# Patient Record
Sex: Female | Born: 1975 | Race: Black or African American | Hispanic: No | Marital: Single | Smoking: Current every day smoker
Health system: Southern US, Community
[De-identification: ages and names within clinical notes are randomized; demographics above are authoritative.]

## PROBLEM LIST (undated history)

## (undated) DIAGNOSIS — J45909 Unspecified asthma, uncomplicated: Secondary | ICD-10-CM

## (undated) DIAGNOSIS — E079 Disorder of thyroid, unspecified: Secondary | ICD-10-CM

## (undated) DIAGNOSIS — E119 Type 2 diabetes mellitus without complications: Secondary | ICD-10-CM

## (undated) DIAGNOSIS — F319 Bipolar disorder, unspecified: Secondary | ICD-10-CM

## (undated) HISTORY — PX: APPENDECTOMY: SHX54

---

## 2006-12-17 ENCOUNTER — Emergency Department (HOSPITAL_COMMUNITY): Admission: EM | Admit: 2006-12-17 | Discharge: 2006-12-17 | Payer: Self-pay | Admitting: Emergency Medicine

## 2011-01-28 LAB — PREGNANCY, URINE: Preg Test, Ur: NEGATIVE

## 2011-01-28 LAB — CBC
MCHC: 34
Platelets: 238
RDW: 13.4

## 2011-01-28 LAB — BASIC METABOLIC PANEL
BUN: 4 — ABNORMAL LOW
CO2: 23
Calcium: 9.5
Creatinine, Ser: 0.76
GFR calc Af Amer: >60
Glucose, Bld: 89

## 2016-03-15 ENCOUNTER — Encounter (HOSPITAL_BASED_OUTPATIENT_CLINIC_OR_DEPARTMENT_OTHER): Payer: Self-pay

## 2016-03-15 ENCOUNTER — Emergency Department (HOSPITAL_BASED_OUTPATIENT_CLINIC_OR_DEPARTMENT_OTHER)
Admission: EM | Admit: 2016-03-15 | Discharge: 2016-03-15 | Disposition: A | Discharge status: Home / Self Care | Payer: Medicare HMO | Attending: Emergency Medicine | Admitting: Emergency Medicine

## 2016-03-15 DIAGNOSIS — M545 Low back pain: Secondary | ICD-10-CM | POA: Diagnosis not present

## 2016-03-15 DIAGNOSIS — F17200 Nicotine dependence, unspecified, uncomplicated: Secondary | ICD-10-CM | POA: Diagnosis not present

## 2016-03-15 DIAGNOSIS — W19XXXA Unspecified fall, initial encounter: Secondary | ICD-10-CM

## 2016-03-15 DIAGNOSIS — S3992XA Unspecified injury of lower back, initial encounter: Secondary | ICD-10-CM | POA: Diagnosis present

## 2016-03-15 DIAGNOSIS — Y929 Unspecified place or not applicable: Secondary | ICD-10-CM | POA: Insufficient documentation

## 2016-03-15 DIAGNOSIS — Y9301 Activity, walking, marching and hiking: Secondary | ICD-10-CM | POA: Insufficient documentation

## 2016-03-15 DIAGNOSIS — Y999 Unspecified external cause status: Secondary | ICD-10-CM | POA: Insufficient documentation

## 2016-03-15 DIAGNOSIS — W1839XA Other fall on same level, initial encounter: Secondary | ICD-10-CM | POA: Insufficient documentation

## 2016-03-15 DIAGNOSIS — J45909 Unspecified asthma, uncomplicated: Secondary | ICD-10-CM | POA: Insufficient documentation

## 2016-03-15 HISTORY — DX: Bipolar disorder, unspecified: F31.9

## 2016-03-15 HISTORY — DX: Unspecified asthma, uncomplicated: J45.909

## 2016-03-15 HISTORY — DX: Disorder of thyroid, unspecified: E07.9

## 2016-03-15 MED ORDER — CYCLOBENZAPRINE HCL 10 MG PO TABS: 10.0000 mg | ORAL_TABLET | Freq: Two times a day (BID) | ORAL | 0 refills | Status: DC | PRN

## 2016-03-15 NOTE — ED Provider Notes (Signed)
Emergency Department Provider Note   I have reviewed the triage vital signs and the nursing notes.  By signing my name below, I, Bridgette HabermannMaria Tan, attest that this documentation has been prepared under the direction and in the presence of Maia PlanJoshua G Merlen Gurry, MD. Electronically Signed: Bridgette HabermannMaria Tan, ED Scribe. 03/15/16. 10:29 PM.  HISTORY  Chief Complaint Fall  HPI Comments: Shelly Mcclain is a 40 y.o. female with h/o asthma who presents to the Emergency Department complaining of bilateral buttock pain s/p mechanical fall onset earlier today. Pt states she was walking when she fell on her wooden patio and landed on her buttocks. No LOC. Pt denies head injury. Pain is exacerbated with movement and direct palpation to the area and improved when standing. She has taken 3 Tylenol PM with moderate relief to her symptoms. Denies any additional injuries. Pt further denies chest pain, difficulty breathing, fever, or any other associated symptoms.   Past Medical History:  Diagnosis Date  . Asthma   . Bipolar disorder (HCC)   . Thyroid disease     There are no active problems to display for this patient.   Past Surgical History:  Procedure Laterality Date  . APPENDECTOMY        Allergies Patient has no known allergies.  No family history on file.  Social History Social History  Substance Use Topics  . Smoking status: Current Every Day Smoker  . Smokeless tobacco: Never Used  . Alcohol use No    Review of Systems Constitutional: No fever/chills Eyes: No visual changes. ENT: No sore throat. Cardiovascular: Denies chest pain. Respiratory: Denies shortness of breath. Gastrointestinal: No abdominal pain.  No nausea, no vomiting.  No diarrhea.  No constipation. Genitourinary: Negative for dysuria. Musculoskeletal: Negative for back pain. Skin: Negative for rash. Neurological: Negative for headaches, focal weakness or numbness. 10-point ROS otherwise  negative.  ____________________________________________   PHYSICAL EXAM:  VITAL SIGNS: ED Triage Vitals  Enc Vitals Group     BP 03/15/16 2040 135/88     Pulse Rate 03/15/16 2040 97     Resp 03/15/16 2040 18     Temp 03/15/16 2040 98.1 F (36.7 C)     Temp Source 03/15/16 2040 Oral     SpO2 03/15/16 2040 98 %     Weight 03/15/16 2039 230 lb (104.3 kg)     Height 03/15/16 2039 5\' 3"  (1.6 m)     Pain Score 03/15/16 2037 8    Constitutional: Alert and oriented. Well appearing and in no acute distress. Eyes: Conjunctivae are normal.  Head: Atraumatic. Nose: No congestion/rhinnorhea. Mouth/Throat: Mucous membranes are moist.  Neck: No stridor.  No cervical spine tenderness to palpation. Cardiovascular: Normal rate, regular rhythm. Good peripheral circulation. Grossly normal heart sounds.   Respiratory: Normal respiratory effort.  No retractions. Lungs CTAB. Gastrointestinal: Soft and nontender. No distention.  Musculoskeletal: No lower extremity tenderness nor edema. No gross deformities of extremities. No midline or paraspinal tenderness.  Neurologic:  Normal speech and language. No gross focal neurologic deficits are appreciated.  Skin:  Skin is warm, dry and intact. No rash noted.  ____________________________________________  RADIOLOGY  None ____________________________________________   PROCEDURES  Procedure(s) performed:   Procedures  None ____________________________________________   INITIAL IMPRESSION / ASSESSMENT AND PLAN / ED COURSE  Pertinent labs & imaging results that were available during my care of the patient were reviewed by me and considered in my medical decision making (see chart for details).  Patient presents to the  ED with lower back pain in the setting of mechanical fall. No midline spine tenderness. Patient is ambulatory with full ROM of all extremities. No point tenderness to require imaging at this time. Suspect muscle strain. Plan for  Robaxin at home and PCP follow up PRN.   At this time, I do not feel there is any life-threatening condition present. I have reviewed and discussed all results (EKG, imaging, lab, urine as appropriate), exam findings with patient. I have reviewed nursing notes and appropriate previous records.  I feel the patient is safe to be discharged home without further emergent workup. Discussed usual and customary return precautions. Patient and family (if present) verbalize understanding and are comfortable with this plan.  Patient will follow-up with their primary care provider. If they do not have a primary care provider, information for follow-up has been provided to them. All questions have been answered.   ____________________________________________  FINAL CLINICAL IMPRESSION(S) / ED DIAGNOSES  Final diagnoses:  Fall, initial encounter     MEDICATIONS GIVEN DURING THIS VISIT:  None  NEW OUTPATIENT MEDICATIONS STARTED DURING THIS VISIT:  Discharge Medication List as of 03/15/2016 10:31 PM    START taking these medications   Details  cyclobenzaprine (FLEXERIL) 10 MG tablet Take 1 tablet (10 mg total) by mouth 2 (two) times daily as needed for muscle spasms., Starting Tue 03/15/2016, Print       I personally performed the services described in this documentation, which was scribed in my presence. The recorded information has been reviewed and is accurate.    Note:  This document was prepared using Dragon voice recognition software and may include unintentional dictation errors.  Alona BeneJoshua Virgilio Broadhead, MD Emergency Medicine    Maia PlanJoshua G Bomani Oommen, MD 03/16/16 [phone]

## 2016-03-15 NOTE — ED Triage Notes (Signed)
Slip/fall today-pain to "my butt' and left wrist-NAD-slow gait

## 2016-03-15 NOTE — Discharge Instructions (Signed)

## 2016-10-31 ENCOUNTER — Emergency Department (HOSPITAL_BASED_OUTPATIENT_CLINIC_OR_DEPARTMENT_OTHER)
Admission: EM | Admit: 2016-10-31 | Discharge: 2016-10-31 | Disposition: A | Discharge status: Home / Self Care | Payer: Medicare Other | Attending: Emergency Medicine | Admitting: Emergency Medicine

## 2016-10-31 ENCOUNTER — Encounter (HOSPITAL_BASED_OUTPATIENT_CLINIC_OR_DEPARTMENT_OTHER): Payer: Self-pay | Admitting: Emergency Medicine

## 2016-10-31 DIAGNOSIS — H66001 Acute suppurative otitis media without spontaneous rupture of ear drum, right ear: Secondary | ICD-10-CM | POA: Insufficient documentation

## 2016-10-31 DIAGNOSIS — H9201 Otalgia, right ear: Secondary | ICD-10-CM | POA: Diagnosis present

## 2016-10-31 DIAGNOSIS — H6691 Otitis media, unspecified, right ear: Secondary | ICD-10-CM

## 2016-10-31 DIAGNOSIS — J45909 Unspecified asthma, uncomplicated: Secondary | ICD-10-CM | POA: Insufficient documentation

## 2016-10-31 DIAGNOSIS — F17200 Nicotine dependence, unspecified, uncomplicated: Secondary | ICD-10-CM | POA: Insufficient documentation

## 2016-10-31 MED ORDER — CETIRIZINE HCL 10 MG PO TABS: 10.0000 mg | ORAL_TABLET | Freq: Every day | ORAL | 0 refills | Status: DC

## 2016-10-31 MED ORDER — AMOXICILLIN 875 MG PO TABS: 875.0000 mg | ORAL_TABLET | Freq: Two times a day (BID) | ORAL | 0 refills | Status: AC

## 2016-10-31 NOTE — ED Triage Notes (Signed)
Pt presents with c/o right ear pain for the past week

## 2016-10-31 NOTE — Discharge Instructions (Signed)
Please take Ibuprofen (Advil, motrin) and Tylenol (acetaminophen) to relieve your pain.  You may take up to 600 MG (3 pills) of normal strength ibuprofen every 8 hours as needed.  In between doses of ibuprofen you make take tylenol, up to 1,000 mg (two extra strength pills).  Do not take more than 4,000 mg tylenol in a 24 hour period.  Please check all medication labels as many medications such as pain and cold medications may contain tylenol.  Do not drink alcohol while taking these medications.  Do not take other NSAID'S while taking ibuprofen (such as aleve or naproxen).  Please take ibuprofen with food to decrease stomach upset.  Your antibiotic may give you diarrhea.  Please stay well hydrated.

## 2016-10-31 NOTE — ED Notes (Signed)
ED Provider at bedside. 

## 2016-10-31 NOTE — ED Provider Notes (Signed)
[facility]   CSN: [id number] Arrival date & time: 10/31/16  0029     History   Chief Complaint Chief Complaint  Patient presents with  . Otalgia    HPI Shelly Mcclain is a 41 y.o. female who presents with 1 week of gradually worsening right ear pain. She has not tried any interventions PTA or seen any providers for this complaint so far. She says that she has some mild throat discomfort on her right side.    HPI  Past Medical History:  Diagnosis Date  . Asthma   . Bipolar disorder (HCC)   . Thyroid disease     There are no active problems to display for this patient.   Past Surgical History:  Procedure Laterality Date  . APPENDECTOMY      OB History    No data available       Home Medications    Prior to Admission medications   Medication Sig Start Date End Date Taking? Authorizing Provider  amoxicillin (AMOXIL) 875 MG tablet Take 1 tablet (875 mg total) by mouth 2 (two) times daily. 10/31/16 11/07/16  Cristina Gong, PA-C  cetirizine (ZYRTEC) 10 MG tablet Take 1 tablet (10 mg total) by mouth daily. 10/31/16   Cristina Gong, PA-C  cyclobenzaprine (FLEXERIL) 10 MG tablet Take 1 tablet (10 mg total) by mouth 2 (two) times daily as needed for muscle spasms. 03/15/16   Long, Arlyss Repress, MD    Family History No family history on file.  Social History Social History  Substance Use Topics  . Smoking status: Current Every Day Smoker  . Smokeless tobacco: Never Used  . Alcohol use No     Allergies   Patient has no known allergies.   Review of Systems Review of Systems  Constitutional: Negative for fatigue and fever.  HENT: Positive for ear pain and sore throat. Negative for congestion, drooling, ear discharge, facial swelling, postnasal drip, trouble swallowing and voice change.   Gastrointestinal: Negative for nausea.  Skin: Negative for color change and wound.  Neurological: Negative for light-headedness and  headaches.     Physical Exam Updated Vital Signs BP 118/83 (BP Location: Left Arm)   Pulse 86   Temp 97.9 F (36.6 C) (Oral)   Resp 19   Ht 5\' 2"  (1.575 m)   Wt 104.3 kg (230 lb)   SpO2 100%   BMI 42.07 kg/m   Physical Exam  Constitutional: She appears well-developed and well-nourished.  HENT:  Head: Normocephalic and atraumatic.  Right Ear: External ear and ear canal normal. No drainage or swelling. No mastoid tenderness. Tympanic membrane is erythematous. Tympanic membrane is not bulging. A middle ear effusion is present.  Left Ear: Tympanic membrane, external ear and ear canal normal.  Nose: Mucosal edema present. Right sinus exhibits no maxillary sinus tenderness and no frontal sinus tenderness. Left sinus exhibits no maxillary sinus tenderness and no frontal sinus tenderness.  Mouth/Throat: Uvula is midline, oropharynx is clear and moist and mucous membranes are normal. No trismus in the jaw. No dental abscesses or uvula swelling. No oropharyngeal exudate, posterior oropharyngeal edema or posterior oropharyngeal erythema. No tonsillar exudate.  Eyes: Conjunctivae are normal. No scleral icterus.  Neck: Trachea normal, normal range of motion and phonation normal. Neck supple. No neck rigidity. No tracheal deviation and normal range of motion present.  Pulmonary/Chest: Effort normal and breath sounds normal. No stridor. No respiratory distress.  Lymphadenopathy:    She has no  cervical adenopathy.  Skin: Skin is warm and dry. She is not diaphoretic.  Psychiatric: Her affect is blunt.  Nursing note and vitals reviewed.    ED Treatments / Results  Labs (all labs ordered are listed, but only abnormal results are displayed) Labs Reviewed - No data to display  EKG  EKG Interpretation None       Radiology No results found.  Procedures Procedures (including critical care time)  Medications Ordered in ED Medications - No data to display   Initial Impression /  Assessment and Plan / ED Course  I have reviewed the triage vital signs and the nursing notes.  Pertinent labs & imaging results that were available during my care of the patient were reviewed by me and considered in my medical decision making (see chart for details).    Patient presents with otalgia and exam consistent with acute otitis media. No concern for acute mastoiditis, meningitis.  No antibiotic use in the last month.  Patient discharged home with Amoxicillin.  Suggest symptoms may be exacerbated by seasonal allergies, will give rx for zyrtec as she says she has run out.  I have also discussed reasons to return immediately to the ER.  Patient expresses understanding and agrees with plan.  The patient was seen by Dr. Patria Maneampos.     Final Clinical Impressions(s) / ED Diagnoses   Final diagnoses:  Right otitis media, unspecified otitis media type    New Prescriptions New Prescriptions   AMOXICILLIN (AMOXIL) 875 MG TABLET    Take 1 tablet (875 mg total) by mouth 2 (two) times daily.   CETIRIZINE (ZYRTEC) 10 MG TABLET    Take 1 tablet (10 mg total) by mouth daily.     Norman ClayHammond, Tihanna Goodson W, PA-C 10/31/16 [id number]    Azalia Bilisampos, Kevin, MD 10/31/16 [phone]

## 2017-03-11 ENCOUNTER — Other Ambulatory Visit: Payer: Self-pay

## 2017-03-11 ENCOUNTER — Emergency Department (HOSPITAL_BASED_OUTPATIENT_CLINIC_OR_DEPARTMENT_OTHER)
Admission: EM | Admit: 2017-03-11 | Discharge: 2017-03-11 | Disposition: A | Discharge status: Home / Self Care | Payer: Medicare Other | Attending: Emergency Medicine | Admitting: Emergency Medicine

## 2017-03-11 ENCOUNTER — Encounter (HOSPITAL_BASED_OUTPATIENT_CLINIC_OR_DEPARTMENT_OTHER): Payer: Self-pay | Admitting: Emergency Medicine

## 2017-03-11 DIAGNOSIS — Z79899 Other long term (current) drug therapy: Secondary | ICD-10-CM | POA: Diagnosis not present

## 2017-03-11 DIAGNOSIS — J020 Streptococcal pharyngitis: Secondary | ICD-10-CM | POA: Diagnosis not present

## 2017-03-11 DIAGNOSIS — J45909 Unspecified asthma, uncomplicated: Secondary | ICD-10-CM | POA: Insufficient documentation

## 2017-03-11 DIAGNOSIS — R05 Cough: Secondary | ICD-10-CM | POA: Diagnosis not present

## 2017-03-11 DIAGNOSIS — F17210 Nicotine dependence, cigarettes, uncomplicated: Secondary | ICD-10-CM | POA: Diagnosis not present

## 2017-03-11 DIAGNOSIS — J029 Acute pharyngitis, unspecified: Secondary | ICD-10-CM | POA: Diagnosis present

## 2017-03-11 LAB — RAPID STREP SCREEN (MED CTR MEBANE ONLY): Streptococcus, Group A Screen (Direct): POSITIVE — AB

## 2017-03-11 MED ORDER — IBUPROFEN 400 MG PO TABS
600.0000 mg | ORAL_TABLET | Freq: Once | ORAL | Status: AC
  Administered 2017-03-11: 600 mg via ORAL
  Filled 2017-03-11: qty 1

## 2017-03-11 MED ORDER — PENICILLIN G BENZATHINE 1200000 UNIT/2ML IM SUSP
1.2000 10*6.[IU] | Freq: Once | INTRAMUSCULAR | Status: AC
  Administered 2017-03-11: 1.2 10*6.[IU] via INTRAMUSCULAR
  Filled 2017-03-11: qty 2

## 2017-03-11 MED ORDER — DEXAMETHASONE SODIUM PHOSPHATE 10 MG/ML IJ SOLN
10.0000 mg | Freq: Once | INTRAMUSCULAR | Status: AC
  Administered 2017-03-11: 10 mg
  Filled 2017-03-11: qty 1

## 2017-03-11 NOTE — ED Provider Notes (Signed)
[facility] EMERGENCY DEPARTMENT Provider Note   CSN: [id number] Arrival date & time: 03/11/17  1611     History   Chief Complaint Chief Complaint  Patient presents with  . Sore Throat  . Otalgia    HPI Shelly Mcclain is a 41 y.o. female.  HPI 41-year African-American female past medical history significant for asthma and thyroid disease presents to the emergency department today with complaints of sore throat and right ear pain.  Patient states her symptoms have been ongoing for 2 days and progressively worsening.  Reports associated rhinorrhea, nonproductive cough, low-grade fever.  Patient has been trying throat spray to help numb her throat.  Reports sick contacts.  Nothing makes her symptoms better or worse.  Patient is able to tolerate p.o. fluids. Past Medical History:  Diagnosis Date  . Asthma   . Bipolar disorder (HCC)   . Thyroid disease     There are no active problems to display for this patient.   Past Surgical History:  Procedure Laterality Date  . APPENDECTOMY      OB History    No data available       Home Medications    Prior to Admission medications   Medication Sig Start Date End Date Taking? Authorizing Provider  levothyroxine (SYNTHROID, LEVOTHROID) 75 MCG tablet Take 75 mcg by mouth daily before breakfast.   Yes [provider]  cetirizine (ZYRTEC) 10 MG tablet Take 1 tablet (10 mg total) by mouth daily. 10/31/16   Cristina GongHammond, Elizabeth W, PA-C  cyclobenzaprine (FLEXERIL) 10 MG tablet Take 1 tablet (10 mg total) by mouth 2 (two) times daily as needed for muscle spasms. 03/15/16   Long, Arlyss RepressJoshua G, MD    Family History No family history on file.  Social History Social History   Tobacco Use  . Smoking status: Current Every Day Smoker  . Smokeless tobacco: Never Used  Substance Use Topics  . Alcohol use: No  . Drug use: No     Allergies   Patient has no known allergies.   Review of Systems Review of Systems    Constitutional: Negative for chills and fever.  HENT: Positive for congestion, ear pain, postnasal drip, rhinorrhea and sore throat.   Respiratory: Positive for cough. Negative for shortness of breath.   Gastrointestinal: Negative for vomiting.  Musculoskeletal: Negative for myalgias.  Skin: Negative for rash.     Physical Exam Updated Vital Signs BP 105/75 (BP Location: Left Arm)   Pulse 99   Resp 18   Ht 5\' 3"  (1.6 m)   Wt 108.9 kg (240 lb)   LMP 03/12/2016   SpO2 100%   BMI 42.51 kg/m   Physical Exam  Constitutional: She appears well-developed and well-nourished. No distress.  HENT:  Head: Normocephalic and atraumatic.  Right Ear: Tympanic membrane and ear canal normal. No middle ear effusion.  Left Ear: Tympanic membrane and ear canal normal.  No middle ear effusion.  Mouth/Throat: Uvula is midline and mucous membranes are normal. No uvula swelling. Posterior oropharyngeal erythema present. No oropharyngeal exudate, posterior oropharyngeal edema or tonsillar abscesses. Tonsils are 1+ on the right. Tonsils are 1+ on the left. No tonsillar exudate.  Eyes: Right eye exhibits no discharge. Left eye exhibits no discharge. No scleral icterus.  Neck: Normal range of motion. Neck supple.  No c spine midline tenderness. No paraspinal tenderness. No deformities or step offs noted. Full ROM. Supple. No nuchal rigidity.    Pulmonary/Chest: Effort normal and breath sounds  normal. No stridor. No respiratory distress. She has no wheezes. She has no rhonchi. She has no rales. She exhibits no tenderness.  Musculoskeletal: Normal range of motion.  Neurological: She is alert.  Skin: No pallor.  Psychiatric: Her behavior is normal. Judgment and thought content normal.  Nursing note and vitals reviewed.    ED Treatments / Results  Labs (all labs ordered are listed, but only abnormal results are displayed) Labs Reviewed  RAPID STREP SCREEN (NOT AT [facility])    EKG  EKG  Interpretation None       Radiology No results found.  Procedures Procedures (including critical care time)  Medications Ordered in ED Medications  ibuprofen (ADVIL,MOTRIN) tablet 600 mg (600 mg Oral Given 03/11/17 1651)     Initial Impression / Assessment and Plan / ED Course  I have reviewed the triage vital signs and the nursing notes.  Pertinent labs & imaging results that were available during my care of the patient were reviewed by me and considered in my medical decision making (see chart for details).    Pt febrile with tonsillar exudate, cervical lymphadenopathy, & dysphagia; diagnosis of strep. Treated in the Ed with steroids, NSAIDs, Pain medication and PCN IM.  Pt appears mildly dehydrated, discussed importance of water rehydration. Presentation non concerning for PTA or infxn spread to soft tissue. No trismus or uvula deviation. No meningeal signs.  Specific return precautions discussed. Pt able to drink water in ED without difficulty with intact air way. Recommended PCP follow up.    Final Clinical Impressions(s) / ED Diagnoses   Final diagnoses:  Strep pharyngitis    ED Discharge Orders    None       Rise MuLeaphart, Sadira Standard T, PA-C 03/11/17 1748    Rise MuLeaphart, Arshawn Valdez T, PA-C 03/11/17 1749    Alvira MondaySchlossman, Erin, MD 03/16/17 [phone]

## 2017-03-11 NOTE — ED Triage Notes (Signed)
Sore throat and R ear pain x 2 days.

## 2017-03-11 NOTE — Discharge Instructions (Signed)
You have been diagnosed with strep throat.  discard  you or your child's toothbrush and begin using a new one in 3 days. For sore throat, take ibuprofen or tylenol every 6hr as needed. Follow up with your doctor in 2-3 days if no improvement. Return to the ED sooner for worsening condition, inability to swallow, breathing difficulty, new concerns.

## 2019-08-20 ENCOUNTER — Other Ambulatory Visit: Payer: Self-pay

## 2019-08-20 ENCOUNTER — Encounter (HOSPITAL_BASED_OUTPATIENT_CLINIC_OR_DEPARTMENT_OTHER): Payer: Self-pay | Admitting: Emergency Medicine

## 2019-08-20 ENCOUNTER — Emergency Department (HOSPITAL_BASED_OUTPATIENT_CLINIC_OR_DEPARTMENT_OTHER)
Admission: EM | Admit: 2019-08-20 | Discharge: 2019-08-21 | Disposition: A | Discharge status: Home / Self Care | Payer: Medicare Other | Attending: Emergency Medicine | Admitting: Emergency Medicine

## 2019-08-20 DIAGNOSIS — J45909 Unspecified asthma, uncomplicated: Secondary | ICD-10-CM | POA: Insufficient documentation

## 2019-08-20 DIAGNOSIS — U071 COVID-19: Secondary | ICD-10-CM | POA: Diagnosis not present

## 2019-08-20 DIAGNOSIS — E119 Type 2 diabetes mellitus without complications: Secondary | ICD-10-CM | POA: Insufficient documentation

## 2019-08-20 DIAGNOSIS — Z79899 Other long term (current) drug therapy: Secondary | ICD-10-CM | POA: Insufficient documentation

## 2019-08-20 DIAGNOSIS — Z7984 Long term (current) use of oral hypoglycemic drugs: Secondary | ICD-10-CM | POA: Insufficient documentation

## 2019-08-20 DIAGNOSIS — Z7982 Long term (current) use of aspirin: Secondary | ICD-10-CM | POA: Diagnosis not present

## 2019-08-20 DIAGNOSIS — F17200 Nicotine dependence, unspecified, uncomplicated: Secondary | ICD-10-CM | POA: Diagnosis not present

## 2019-08-20 DIAGNOSIS — R0789 Other chest pain: Secondary | ICD-10-CM

## 2019-08-20 HISTORY — DX: Type 2 diabetes mellitus without complications: E11.9

## 2019-08-20 MED ORDER — FLUTICASONE FUROATE-VILANTEROL 100-25 MCG/INH IN AEPB
1.00 | INHALATION_SPRAY | RESPIRATORY_TRACT | Status: DC
Start: 2019-08-19 — End: 2019-08-20

## 2019-08-20 MED ORDER — ACETAMINOPHEN 325 MG PO TABS
650.00 | ORAL_TABLET | ORAL | Status: DC
Start: ? — End: 2019-08-20

## 2019-08-20 MED ORDER — SORBITOL 70 % PO SOLN
30.00 | ORAL | Status: DC
Start: ? — End: 2019-08-20

## 2019-08-20 MED ORDER — ALBUTEROL SULFATE HFA 108 (90 BASE) MCG/ACT IN AERS
2.00 | INHALATION_SPRAY | RESPIRATORY_TRACT | Status: DC
Start: ? — End: 2019-08-20

## 2019-08-20 MED ORDER — MELATONIN 3 MG PO TABS
3.00 | ORAL_TABLET | ORAL | Status: DC
Start: ? — End: 2019-08-20

## 2019-08-20 MED ORDER — DEXTROMETHORPHAN-GUAIFENESIN 10-100 MG/5ML PO LIQD
5.00 | ORAL | Status: DC
Start: ? — End: 2019-08-20

## 2019-08-20 MED ORDER — GLUCAGON (RDNA) 1 MG IJ KIT
1.00 | PACK | INTRAMUSCULAR | Status: DC
Start: ? — End: 2019-08-20

## 2019-08-20 MED ORDER — FERROUS SULFATE 325 (65 FE) MG PO TABS
325.00 | ORAL_TABLET | ORAL | Status: DC
Start: 2019-08-19 — End: 2019-08-20

## 2019-08-20 MED ORDER — PANTOPRAZOLE SODIUM 40 MG PO TBEC
40.00 | DELAYED_RELEASE_TABLET | ORAL | Status: DC
Start: 2019-08-19 — End: 2019-08-20

## 2019-08-20 MED ORDER — ONDANSETRON HCL 4 MG/2ML IJ SOLN
4.00 | INTRAMUSCULAR | Status: DC
Start: ? — End: 2019-08-20

## 2019-08-20 MED ORDER — HYDROXYZINE HCL 25 MG PO TABS
25.00 | ORAL_TABLET | ORAL | Status: DC
Start: ? — End: 2019-08-20

## 2019-08-20 MED ORDER — ATORVASTATIN CALCIUM 10 MG PO TABS
20.00 | ORAL_TABLET | ORAL | Status: DC
Start: 2019-08-19 — End: 2019-08-20

## 2019-08-20 MED ORDER — OXCARBAZEPINE 300 MG PO TABS
300.00 | ORAL_TABLET | ORAL | Status: DC
Start: 2019-08-18 — End: 2019-08-20

## 2019-08-20 MED ORDER — DEXTROSE 10 % IV SOLN
125.00 | INTRAVENOUS | Status: DC
Start: ? — End: 2019-08-20

## 2019-08-20 MED ORDER — INSULIN LISPRO 100 UNIT/ML ~~LOC~~ SOLN
2.00 | SUBCUTANEOUS | Status: DC
Start: 2019-08-18 — End: 2019-08-20

## 2019-08-20 MED ORDER — BENZTROPINE MESYLATE 1 MG PO TABS
0.50 | ORAL_TABLET | ORAL | Status: DC
Start: 2019-08-18 — End: 2019-08-20

## 2019-08-20 MED ORDER — ASPIRIN 81 MG PO TBEC
81.00 | DELAYED_RELEASE_TABLET | ORAL | Status: DC
Start: 2019-08-19 — End: 2019-08-20

## 2019-08-20 MED ORDER — GLUCOSE 40 % PO GEL
15.00 | ORAL | Status: DC
Start: ? — End: 2019-08-20

## 2019-08-20 MED ORDER — BUSPIRONE HCL 15 MG PO TABS
7.50 | ORAL_TABLET | ORAL | Status: DC
Start: 2019-08-18 — End: 2019-08-20

## 2019-08-20 MED ORDER — BISACODYL 5 MG PO TBEC
10.00 | DELAYED_RELEASE_TABLET | ORAL | Status: DC
Start: ? — End: 2019-08-20

## 2019-08-20 MED ORDER — SODIUM CHLORIDE FLUSH 0.9 % IV SOLN
5.00 | INTRAVENOUS | Status: DC
Start: 2019-08-18 — End: 2019-08-20

## 2019-08-20 MED ORDER — CLONIDINE HCL 0.1 MG PO TABS
0.10 | ORAL_TABLET | ORAL | Status: DC
Start: ? — End: 2019-08-20

## 2019-08-20 MED ORDER — LEVOTHYROXINE SODIUM 125 MCG PO TABS
125.00 | ORAL_TABLET | ORAL | Status: DC
Start: 2019-08-19 — End: 2019-08-20

## 2019-08-20 MED ORDER — PERPHENAZINE 2 MG PO TABS
4.00 | ORAL_TABLET | ORAL | Status: DC
Start: 2019-08-18 — End: 2019-08-20

## 2019-08-20 MED ORDER — DSS 100 MG PO CAPS
100.00 | ORAL_CAPSULE | ORAL | Status: DC
Start: ? — End: 2019-08-20

## 2019-08-20 MED ORDER — SODIUM CHLORIDE FLUSH 0.9 % IV SOLN
5.00 | INTRAVENOUS | Status: DC
Start: ? — End: 2019-08-20

## 2019-08-20 NOTE — ED Triage Notes (Signed)
States chest pressure and cough since last weekend. Tested COVID + on Sunday

## 2019-08-21 ENCOUNTER — Emergency Department (HOSPITAL_BASED_OUTPATIENT_CLINIC_OR_DEPARTMENT_OTHER): Payer: Medicare Other

## 2019-08-21 LAB — URINALYSIS, MICROSCOPIC (REFLEX)

## 2019-08-21 LAB — COMPREHENSIVE METABOLIC PANEL
ALT: 15 U/L (ref 0–44)
AST: 16 U/L (ref 15–41)
Albumin: 3.9 g/dL (ref 3.5–5.0)
Alkaline Phosphatase: 52 U/L (ref 38–126)
Anion gap: 9 (ref 5–15)
BUN: 10 mg/dL (ref 6–20)
CO2: 25 mmol/L (ref 22–32)
Calcium: 9 mg/dL (ref 8.9–10.3)
Chloride: 104 mmol/L (ref 98–111)
Creatinine, Ser: 0.73 mg/dL (ref 0.44–1.00)
GFR calc Af Amer: >60 mL/min (ref >60)
GFR calc non Af Amer: >60 mL/min (ref >60)
Glucose, Bld: 82 mg/dL (ref 70–99)
Potassium: 3.7 mmol/L (ref 3.5–5.1)
Sodium: 138 mmol/L (ref 135–145)
Total Bilirubin: 0.5 mg/dL (ref 0.3–1.2)
Total Protein: 6.9 g/dL (ref 6.5–8.1)

## 2019-08-21 LAB — CBC WITH DIFFERENTIAL/PLATELET
Abs Immature Granulocytes: 0.02 10*3/uL (ref 0.00–0.07)
Basophils Absolute: 0 10*3/uL (ref 0.0–0.1)
Basophils Relative: 1 %
Eosinophils Absolute: 0.2 10*3/uL (ref 0.0–0.5)
Eosinophils Relative: 2 %
HCT: 39.6 % (ref 36.0–46.0)
Hemoglobin: 12.6 g/dL (ref 12.0–15.0)
Immature Granulocytes: 0 %
Lymphocytes Relative: 45 %
Lymphs Abs: 3.5 10*3/uL (ref 0.7–4.0)
MCH: 28.3 pg (ref 26.0–34.0)
MCHC: 31.8 g/dL (ref 30.0–36.0)
MCV: 89 fL (ref 80.0–100.0)
Monocytes Absolute: 0.4 10*3/uL (ref 0.1–1.0)
Monocytes Relative: 6 %
Neutro Abs: 3.6 10*3/uL (ref 1.7–7.7)
Neutrophils Relative %: 46 %
Platelets: 235 10*3/uL (ref 150–400)
RBC: 4.45 MIL/uL (ref 3.87–5.11)
RDW: 15.5 % (ref 11.5–15.5)
WBC: 7.7 10*3/uL (ref 4.0–10.5)
nRBC: 0 % (ref 0.0–0.2)

## 2019-08-21 LAB — URINALYSIS, ROUTINE W REFLEX MICROSCOPIC
Bilirubin Urine: NEGATIVE
Glucose, UA: >=500 mg/dL — AB
Hgb urine dipstick: NEGATIVE
Ketones, ur: NEGATIVE mg/dL
Leukocytes,Ua: NEGATIVE
Nitrite: NEGATIVE
Protein, ur: NEGATIVE mg/dL
Specific Gravity, Urine: <1.005 — ABNORMAL LOW (ref 1.005–1.030)
pH: 6 (ref 5.0–8.0)

## 2019-08-21 LAB — TROPONIN I (HIGH SENSITIVITY)
Troponin I (High Sensitivity): 4 ng/L (ref <18)
Troponin I (High Sensitivity): 3 ng/L (ref <18)

## 2019-08-21 LAB — PREGNANCY, URINE: Preg Test, Ur: NEGATIVE

## 2019-08-21 MED ORDER — ONDANSETRON 4 MG PO TBDP
4.0000 mg | ORAL_TABLET | Freq: Once | ORAL | Status: AC
  Administered 2019-08-21: 4 mg via ORAL
  Filled 2019-08-21: qty 1

## 2019-08-21 MED ORDER — ACETAMINOPHEN 500 MG PO TABS
1000.0000 mg | ORAL_TABLET | Freq: Once | ORAL | Status: AC
  Administered 2019-08-21: 01:00:00 1000 mg via ORAL
  Filled 2019-08-21: qty 2

## 2019-08-21 NOTE — Discharge Instructions (Addendum)
Please quarantine for 10 days after the onset of symptoms.  You will need to be fever free without using Tylenol or Ibuprofen for 3 full days AND your symptoms will need to be significantly improving or resolved (other than the loss of taste and smell which can last up to 3 months) before you can come out of quarantine.  You should isolate from others that you live with as well.  Any close contacts that have seen you in the past 14 days before your symptoms have started will need to be notified and they will need to quarantine for 14 full days even if they have a negative COVID test.  COVID-19 is a viral illness and currently there are no specific outpatient treatments other than supportive measures such as alternating Tylenol and Ibuprofen, rest, increased fluid intake.  You do not need antibiotics.  If you develop chest pain, difficulty breathing, have blue lips or fingertips, begin vomiting and can not stop and can not hold down fluids, feel like you may pass out or you do pass out, have confusion, please return to the emergency department.

## 2019-08-21 NOTE — ED Notes (Signed)
Discharge reviewed with patient.  Waiting for ride.

## 2019-08-21 NOTE — ED Notes (Signed)
PT complained of SOB but ambulated over 3 min and did not fall below 99% throughout ambulation

## 2019-08-21 NOTE — ED Provider Notes (Signed)
TIME SEEN: 12:01 AM  CHIEF COMPLAINT: Covid positive, chest pain and shortness of breath  HPI: Patient is a 44 year old female with history of diabetes, hypothyroidism, bipolar disorder, paranoid schizophrenia, asthma who presents to the emergency department with complaints of chest pressure, shortness of breath after being diagnosed with being Covid positive on Sunday, May 2.  States she was admitted to the hospital at [facility] for chest pain, shortness of breath, nausea and vomiting and then states that after discharge was called by the health department to inform her that she was Covid positive.  She states "I do not know what to do with that information".  She states she is not sure how this needs to be treated and what she needs to be doing at home.  She denies that she wears oxygen.  She did have some nausea today but no vomiting or diarrhea.  She has had body aches and chills.  No documented fevers.  No lower extremity swelling or pain.  She denies any known aggravating or alleviating factors to her symptoms.  On review of records at [facility], patient was admitted to the hospital on 08/17/2019 and observe for 2 days given complaints of chest pain, shortness of breath and was found to be Covid positive while in the hospital.  She did not require oxygen and did not qualify for remdesivir or Decadron.  Was seen and cleared by psychiatry.  ROS: See HPI Constitutional: no fever  Eyes: no drainage  ENT: no runny nose   Cardiovascular:   chest pain  Resp:  SOB  GI: no vomiting GU: no dysuria Integumentary: no rash  Allergy: no hives  Musculoskeletal: no leg swelling  Neurological: no slurred speech ROS otherwise negative  PAST MEDICAL HISTORY/PAST SURGICAL HISTORY:  Past Medical History:  Diagnosis Date  . Asthma   . Bipolar disorder (HCC)   . Diabetes mellitus without complication (HCC)   . Thyroid disease     MEDICATIONS:  Prior to Admission medications    Medication Sig Start Date End Date Taking? Authorizing Provider  albuterol (PROAIR HFA) 108 (90 Base) MCG/ACT inhaler INHALE 2 PUFFS BY MOUTH EVERY 4 TO 6 HOURS AS NEEDED 12/11/18  Yes [provider]  aspirin 81 MG EC tablet Take by mouth. 06/10/19  Yes [provider]  atorvastatin (LIPITOR) 20 MG tablet Take by mouth. 04/22/19  Yes [provider]  busPIRone (BUSPAR) 7.5 MG tablet Take by mouth. 08/01/19  Yes [provider]  carbamazepine (EPITOL) 200 MG tablet  07/05/13  Yes [provider]  cetirizine (ZYRTEC) 10 MG tablet Take 1 tablet (10 mg total) by mouth daily. 10/31/16  Yes Cristina Gong, PA-C  empagliflozin (JARDIANCE) 10 MG TABS tablet Take by mouth. 12/11/18  Yes [provider]  Fluticasone-Salmeterol (ADVAIR) 100-50 MCG/DOSE AEPB Inhale into the lungs. 11/18/16  Yes [provider]  hydrOXYzine (ATARAX/VISTARIL) 25 MG tablet Take by mouth. 04/24/19  Yes [provider]  levothyroxine (SYNTHROID, LEVOTHROID) 75 MCG tablet Take 75 mcg by mouth daily before breakfast.   Yes [provider]  metFORMIN (GLUCOPHAGE-XR) 500 MG 24 hr tablet Take by mouth. 12/11/18  Yes [provider]  omeprazole (PRILOSEC) 40 MG capsule TAKE 1 CAPSULE BY MOUTH DAILY 11/18/16  Yes [provider]  Oxcarbazepine (TRILEPTAL) 300 MG tablet Take 1 tab bid 08/01/19  Yes [provider]  perphenazine (TRILAFON) 4 MG tablet Take by mouth. 06/27/19  Yes [provider]  cyclobenzaprine (FLEXERIL)  10 MG tablet Take 1 tablet (10 mg total) by mouth 2 (two) times daily as needed for muscle spasms. 03/15/16   Long, Wonda Olds, MD    ALLERGIES:  Allergies  Allergen Reactions  . Other Anaphylaxis  . Strawberry Extract Itching    SOCIAL HISTORY:  Social History   Tobacco Use  . Smoking status: Current Every Day Smoker  . Smokeless tobacco: Never Used  Substance Use Topics  . Alcohol use: No    FAMILY  HISTORY: No family history on file.  EXAM: BP 122/64 (BP Location: Right Arm)   Pulse 75   Temp 98.2 F (36.8 C) (Oral)   Resp 20   Ht 5\' 2"  (1.575 m)   Wt 102.5 kg   LMP 08/14/2019   SpO2 100%   BMI 41.34 kg/m  CONSTITUTIONAL: Alert and oriented and responds appropriately to questions. Well-appearing; well-nourished HEAD: Normocephalic EYES: Conjunctivae clear, pupils appear equal, EOM appear intact ENT: normal nose; moist mucous membranes NECK: Supple, normal ROM CARD: RRR; S1 and S2 appreciated; no murmurs, no clicks, no rubs, no gallops RESP: Normal chest excursion without splinting or tachypnea; breath sounds clear and equal bilaterally; no wheezes, no rhonchi, no rales, no hypoxia or respiratory distress, speaking full sentences ABD/GI: Normal bowel sounds; non-distended; soft, non-tender, no rebound, no guarding, no peritoneal signs, no hepatosplenomegaly BACK:  The back appears normal EXT: Normal ROM in all joints; no deformity noted, no edema; no cyanosis, no calf tenderness or calf swelling SKIN: Normal color for age and race; warm; no rash on exposed skin NEURO: Moves all extremities equally PSYCH: The patient's mood and manner are appropriate.  Does not appear to be responding to internal stimuli.  Good eye contact.  MEDICAL DECISION MAKING: Patient here with chest pain, shortness of breath in the setting of COVID-19 infection.  She is well-appearing here without hypoxia, increased work of breathing or respiratory distress.  Will obtain labs including cardiac labs given she does have some risk factors for ACS although this seems atypical.  Will treat with Tylenol, Zofran.  Will ambulate in the room to check for any signs of desaturation.  If patient continues to do well with normal work-up, anticipate discharge home.  Doubt PE, dissection currently.  EKG reviewed/interpreted and shows no ischemia, arrhythmia or interval change.  ED PROGRESS: Patient's labs, urine, imaging  head ear reviewed/interpreted and show no significant abnormality.  First high-sensitivity troponin is negative.  Her chest x-ray is clear.  Urine shows no sign of infection.  Patient able to ambulate without difficulty and no drop in oxygen saturation.  Sats stayed at 99% while ambulating.  She reports she is feeling well right now no longer having chest pain or shortness of breath.  Plan is to obtain second troponin and if this is negative, discharge home with supportive care instructions and return precautions.  She is comfortable with this plan.  2:30 AM  Pt's 2nd troponin is flat at 3.  Continues to be hemodynamically stable without hypoxia or respiratory distress.  I feel she is safe for discharge home.  Discussed supportive care instructions.  Explained to patient why antibiotics would not be indicated at this time.  She verbalized understanding.   At this time, I do not feel there is any life-threatening condition present. I have reviewed, interpreted and discussed all results (EKG, imaging, lab, urine as appropriate) and exam findings with patient/family. I have reviewed nursing notes and appropriate previous records.  I feel the patient is safe  to be discharged home without further emergent workup and can continue workup as an outpatient as needed. Discussed usual and customary return precautions. Patient/family verbalize understanding and are comfortable with this plan.  Outpatient follow-up has been provided as needed. All questions have been answered.   EKG Interpretation  Date/Time:  Tuesday Aug 20 2019 22:02:53 EDT Ventricular Rate:  80 PR Interval:  168 QRS Duration: 78 QT Interval:  372 QTC Calculation: 429 R Axis:   59 Text Interpretation: Normal sinus rhythm Normal ECG No significant change since last tracing Confirmed by Rochele Raring [phone]) on 08/20/2019 11:00:29 PM           Shelly Mcclain was evaluated in Emergency Department on 08/21/2019 for the symptoms described in  the history of present illness. She was evaluated in the context of the global COVID-19 pandemic, which necessitated consideration that the patient might be at risk for infection with the SARS-CoV-2 virus that causes COVID-19. Institutional protocols and algorithms that pertain to the evaluation of patients at risk for COVID-19 are in a state of rapid change based on information released by regulatory bodies including the CDC and federal and state organizations. These policies and algorithms were followed during the patient's care in the ED.      Jacqulynn Shappell, Layla Maw, DO 08/21/19 [phone]

## 2019-09-08 ENCOUNTER — Encounter (HOSPITAL_BASED_OUTPATIENT_CLINIC_OR_DEPARTMENT_OTHER): Payer: Self-pay | Admitting: Emergency Medicine

## 2019-09-08 ENCOUNTER — Emergency Department (HOSPITAL_BASED_OUTPATIENT_CLINIC_OR_DEPARTMENT_OTHER)
Admission: EM | Admit: 2019-09-08 | Discharge: 2019-09-09 | Disposition: A | Discharge status: Home / Self Care | Payer: Medicare Other | Attending: Emergency Medicine | Admitting: Emergency Medicine

## 2019-09-08 ENCOUNTER — Other Ambulatory Visit: Payer: Self-pay

## 2019-09-08 DIAGNOSIS — F17210 Nicotine dependence, cigarettes, uncomplicated: Secondary | ICD-10-CM | POA: Insufficient documentation

## 2019-09-08 DIAGNOSIS — Z7982 Long term (current) use of aspirin: Secondary | ICD-10-CM | POA: Insufficient documentation

## 2019-09-08 DIAGNOSIS — Z79899 Other long term (current) drug therapy: Secondary | ICD-10-CM | POA: Diagnosis not present

## 2019-09-08 DIAGNOSIS — E119 Type 2 diabetes mellitus without complications: Secondary | ICD-10-CM | POA: Insufficient documentation

## 2019-09-08 DIAGNOSIS — J45909 Unspecified asthma, uncomplicated: Secondary | ICD-10-CM | POA: Diagnosis not present

## 2019-09-08 DIAGNOSIS — R42 Dizziness and giddiness: Secondary | ICD-10-CM

## 2019-09-08 DIAGNOSIS — U071 COVID-19: Secondary | ICD-10-CM | POA: Diagnosis not present

## 2019-09-08 DIAGNOSIS — Z7984 Long term (current) use of oral hypoglycemic drugs: Secondary | ICD-10-CM | POA: Diagnosis not present

## 2019-09-08 DIAGNOSIS — R110 Nausea: Secondary | ICD-10-CM | POA: Diagnosis present

## 2019-09-08 MED ORDER — SODIUM CHLORIDE 0.9 % IV BOLUS
1000.0000 mL | Freq: Once | INTRAVENOUS | Status: AC
  Administered 2019-09-09: 1000 mL via INTRAVENOUS

## 2019-09-08 NOTE — ED Triage Notes (Signed)
Arrives via EMS for lightheadedness, nose burning, and nausea. State fire did exam for CO2 and negative. States there's a smell in her home that has been creating these symptoms - ongoing "for a hot minute" per patient - couple of days. Pt alert x4.

## 2019-09-08 NOTE — ED Notes (Signed)
Chart review reflects COVID positive diagnosis on 5/2. EMS reports "a cleaning product" type smell in home. Fire department found no toxins in home. Pt reports she can smell this "in my car, in my sister's car, in my home."

## 2019-09-09 ENCOUNTER — Emergency Department (HOSPITAL_BASED_OUTPATIENT_CLINIC_OR_DEPARTMENT_OTHER): Payer: Medicare Other

## 2019-09-09 LAB — CBC WITH DIFFERENTIAL/PLATELET
Abs Immature Granulocytes: 0.02 10*3/uL (ref 0.00–0.07)
Basophils Absolute: 0 10*3/uL (ref 0.0–0.1)
Basophils Relative: 0 %
Eosinophils Absolute: 0.1 10*3/uL (ref 0.0–0.5)
Eosinophils Relative: 2 %
HCT: 42.9 % (ref 36.0–46.0)
Hemoglobin: 13.5 g/dL (ref 12.0–15.0)
Immature Granulocytes: 0 %
Lymphocytes Relative: 42 %
Lymphs Abs: 3.1 10*3/uL (ref 0.7–4.0)
MCH: 28.2 pg (ref 26.0–34.0)
MCHC: 31.5 g/dL (ref 30.0–36.0)
MCV: 89.7 fL (ref 80.0–100.0)
Monocytes Absolute: 0.5 10*3/uL (ref 0.1–1.0)
Monocytes Relative: 7 %
Neutro Abs: 3.7 10*3/uL (ref 1.7–7.7)
Neutrophils Relative %: 49 %
Platelets: 235 10*3/uL (ref 150–400)
RBC: 4.78 MIL/uL (ref 3.87–5.11)
RDW: 15.6 % — ABNORMAL HIGH (ref 11.5–15.5)
WBC: 7.5 10*3/uL (ref 4.0–10.5)
nRBC: 0 % (ref 0.0–0.2)

## 2019-09-09 LAB — COOXEMETRY PANEL
Carboxyhemoglobin: 4.1 % — ABNORMAL HIGH (ref 0.5–1.5)
Methemoglobin: 1.1 % (ref 0.0–1.5)
O2 Saturation: 76.5 %
Total hemoglobin: 13.5 g/dL (ref 12.0–16.0)

## 2019-09-09 LAB — BASIC METABOLIC PANEL
Anion gap: 12 (ref 5–15)
BUN: 10 mg/dL (ref 6–20)
CO2: 22 mmol/L (ref 22–32)
Calcium: 9.1 mg/dL (ref 8.9–10.3)
Chloride: 105 mmol/L (ref 98–111)
Creatinine, Ser: 0.84 mg/dL (ref 0.44–1.00)
GFR calc Af Amer: >60 mL/min (ref >60)
GFR calc non Af Amer: >60 mL/min (ref >60)
Glucose, Bld: 100 mg/dL — ABNORMAL HIGH (ref 70–99)
Potassium: 3.8 mmol/L (ref 3.5–5.1)
Sodium: 139 mmol/L (ref 135–145)

## 2019-09-09 NOTE — ED Provider Notes (Signed)
[facility] EMERGENCY DEPARTMENT Provider Note   CSN: [id number] Arrival date & time: 09/08/19  2330     History Chief Complaint  Patient presents with  . nose "burning", nausea    Shelly Mcclain is a 44 y.o. female.  HPI     This a 44 year old female with a history of bipolar disorder, diabetes who presents with lightheadedness, nose burning, and nausea.  Patient states that over the last several weeks she has noted an odd smell in her house.  She states that sometimes it is more prominent than others.  Symptoms started after she was diagnosed with COVID-19 in early May.  However she is insistent that other people "smell the smell" and she believes that she is being exposed to gas.  She states that she called out EMS and her carbon monoxide levels in her house were within normal limits.  Patient describes lightheadedness.  She states that she gets lightheaded when changing positions.  She denies room spinning dizziness.  She also reports burning in her nose and nausea.  No chest pain, no shortness of breath or fevers.  No cough.  Chart reviewed.  Patient was admitted to [facility] in early May for chest pain and shortness of breath.  Found to be Covid positive.  Did not qualify for remdesivir or steroids as she was not hypoxic.  Past Medical History:  Diagnosis Date  . Asthma   . Bipolar disorder (HCC)   . Diabetes mellitus without complication (HCC)   . Thyroid disease     There are no problems to display for this patient.   Past Surgical History:  Procedure Laterality Date  . APPENDECTOMY       OB History   No obstetric history on file.     No family history on file.  Social History   Tobacco Use  . Smoking status: Current Every Day Smoker    Packs/day: 0.50    Types: Cigarettes  . Smokeless tobacco: Never Used  Substance Use Topics  . Alcohol use: No  . Drug use: No    Home Medications Prior to Admission medications     Medication Sig Start Date End Date Taking? Authorizing Provider  albuterol (PROAIR HFA) 108 (90 Base) MCG/ACT inhaler INHALE 2 PUFFS BY MOUTH EVERY 4 TO 6 HOURS AS NEEDED 12/11/18   [provider]  aspirin 81 MG EC tablet Take by mouth. 06/10/19   [provider]  atorvastatin (LIPITOR) 20 MG tablet Take by mouth. 04/22/19   [provider]  busPIRone (BUSPAR) 7.5 MG tablet Take by mouth. 08/01/19   [provider]  carbamazepine (EPITOL) 200 MG tablet  07/05/13   [provider]  cetirizine (ZYRTEC) 10 MG tablet Take 1 tablet (10 mg total) by mouth daily. 10/31/16   Cristina Gong, PA-C  cyclobenzaprine (FLEXERIL) 10 MG tablet Take 1 tablet (10 mg total) by mouth 2 (two) times daily as needed for muscle spasms. 03/15/16   Long, Arlyss Repress, MD  empagliflozin (JARDIANCE) 10 MG TABS tablet Take by mouth. 12/11/18   [provider]  Fluticasone-Salmeterol (ADVAIR) 100-50 MCG/DOSE AEPB Inhale into the lungs. 11/18/16   [provider]  hydrOXYzine (ATARAX/VISTARIL) 25 MG tablet Take by mouth. 04/24/19   [provider]  levothyroxine (SYNTHROID, LEVOTHROID) 75 MCG tablet Take 75 mcg by mouth daily before breakfast.    [provider]  metFORMIN (GLUCOPHAGE-XR) 500 MG 24 hr tablet Take by mouth. 12/11/18  [provider]  omeprazole (PRILOSEC) 40 MG capsule TAKE 1 CAPSULE BY MOUTH DAILY 11/18/16   [provider]  Oxcarbazepine (TRILEPTAL) 300 MG tablet Take 1 tab bid 08/01/19   [provider]  perphenazine (TRILAFON) 4 MG tablet Take by mouth. 06/27/19   [provider]    Allergies    Other and Strawberry extract  Review of Systems   Review of Systems  Constitutional: Negative for fever.  Respiratory: Negative for shortness of breath.   Cardiovascular: Negative for chest pain.  Gastrointestinal: Positive for nausea. Negative for abdominal pain, diarrhea and vomiting.  Genitourinary:  Negative for dysuria.  Neurological: Positive for light-headedness. Negative for dizziness, syncope and weakness.  All other systems reviewed and are negative.   Physical Exam Updated Vital Signs BP 131/78   Pulse 67   Temp 98.7 F (37.1 C) (Oral)   Resp (!) 2   Ht 1.575 m (5\' 2" )   Wt 102.5 kg   LMP 08/14/2019   SpO2 100%   BMI 41.33 kg/m   Physical Exam Vitals and nursing note reviewed.  Constitutional:      Appearance: She is well-developed. She is obese. She is not ill-appearing.  HENT:     Head: Normocephalic and atraumatic.     Nose: Nose normal.     Mouth/Throat:     Mouth: Mucous membranes are moist.  Eyes:     Extraocular Movements: Extraocular movements intact.     Pupils: Pupils are equal, round, and reactive to light.  Cardiovascular:     Rate and Rhythm: Normal rate and regular rhythm.     Heart sounds: Normal heart sounds.  Pulmonary:     Effort: Pulmonary effort is normal. No respiratory distress.     Breath sounds: No wheezing.  Abdominal:     General: Bowel sounds are normal.     Palpations: Abdomen is soft.     Tenderness: There is no abdominal tenderness.  Musculoskeletal:     Cervical back: Neck supple.     Right lower leg: No edema.     Left lower leg: No edema.  Skin:    General: Skin is warm and dry.  Neurological:     Mental Status: She is alert and oriented to person, place, and time.     Comments: Cranial nerves II through XII intact, 5 out of 5 strength in all 4 extremities, no dysmetria to finger-nose-finger  Psychiatric:     Comments: Odd affect, at times seems fixated on her symptoms and is difficult to redirect     ED Results / Procedures / Treatments   Labs (all labs ordered are listed, but only abnormal results are displayed) Labs Reviewed  CBC WITH DIFFERENTIAL/PLATELET - Abnormal; Notable for the following components:      Result Value   RDW 15.6 (*)    All other components within normal limits  BASIC METABOLIC PANEL -  Abnormal; Notable for the following components:   Glucose, Bld 100 (*)    All other components within normal limits  COOXEMETRY PANEL - Abnormal; Notable for the following components:   Carboxyhemoglobin 4.1 (*)    All other components within normal limits    EKG EKG Interpretation  Date/Time:  Monday Sep 09 2019 00:01:27 EDT Ventricular Rate:  78 PR Interval:  176 QRS Duration: 76 QT Interval:  380 QTC Calculation: 433 R Axis:   59 Text Interpretation: Normal sinus rhythm Normal ECG Confirmed by Thayer Jew [phone]) on 09/09/2019 1:44:03 AM  Radiology DG Chest Portable 1 View  Result Date: 09/09/2019 CLINICAL DATA:  Lightheadedness EXAM: PORTABLE CHEST 1 VIEW COMPARISON:  Aug 21, 2019 FINDINGS: The heart size and mediastinal contours are within normal limits. Both lungs are clear. The visualized skeletal structures are unremarkable. IMPRESSION: No active disease. Electronically Signed   By: Katherine Mantle M.D.   On: 09/09/2019 01:16    Procedures Procedures (including critical care time)  Medications Ordered in ED Medications  sodium chloride 0.9 % bolus 1,000 mL (1,000 mLs Intravenous New Bag/Given 09/09/19 0050)    ED Course  I have reviewed the triage vital signs and the nursing notes.  Pertinent labs & imaging results that were available during my care of the patient were reviewed by me and considered in my medical decision making (see chart for details).    MDM Rules/Calculators/A&P                       Patient presents with lightheadedness.  Is concerned that a smell or exposure at home is causing her symptoms.  She is overall nontoxic and vital signs are reassuring.  She is not hypoxic.  Neurologic exam is normal.  Symptoms began after her COVID-19 diagnosis.  While most of the time people lose their sense of smell and taste, we know that COVID-19 affects the olfactory senses.  This could be the reason for alteration of her smell.  Lab work obtained.  No  significant metabolic derangements.  No leukocytosis.  Chest x-ray is negative for pneumothorax, pneumonia, Covid changes.  EKG without ischemic or arrhythmic changes.  Cooximetry panel with carbon monoxide level at 4.1.  Patient is a current smoker.  This is likely related to her smoking.  On recheck, patient feels better after some fluids.  Lightheadedness may be related to mild dehydration.  I discussed with the patient my suspicion that some of her symptoms may be sequelae of COVID-19.  She stated understanding.  After history, exam, and medical workup I feel the patient has been appropriately medically screened and is safe for discharge home. Pertinent diagnoses were discussed with the patient. Patient was given return precautions.   Final Clinical Impression(s) / ED Diagnoses Final diagnoses:  COVID-19  Lightheadedness    Rx / DC Orders ED Discharge Orders    None       Renell Allum, Mayer Masker, MD 09/09/19 [phone]

## 2019-09-09 NOTE — ED Notes (Signed)
Staff at bedside.

## 2019-09-09 NOTE — ED Notes (Signed)
Pt requested cab voucher; attempting to find cab service

## 2019-09-09 NOTE — Discharge Instructions (Addendum)
You were seen today for lightheadedness and concerns for exposure at home.  Your work-up is reassuring.  I suspect the smell that you persistently smell at home may be related to your recent COVID-19 diagnosis as we know that this virus can change her olfactory senses.  You were given fluids.  You may be slightly dehydrated as well.  Make sure to increase your fluid intake at home.  Your testing for carbon monoxide poisoning was reassuring.

## 2019-09-13 ENCOUNTER — Encounter (HOSPITAL_BASED_OUTPATIENT_CLINIC_OR_DEPARTMENT_OTHER): Payer: Self-pay | Admitting: *Deleted

## 2019-09-13 ENCOUNTER — Other Ambulatory Visit: Payer: Self-pay

## 2019-09-13 ENCOUNTER — Emergency Department (HOSPITAL_BASED_OUTPATIENT_CLINIC_OR_DEPARTMENT_OTHER)
Admission: EM | Admit: 2019-09-13 | Discharge: 2019-09-14 | Disposition: A | Discharge status: Home / Self Care | Payer: Medicare Other | Attending: Emergency Medicine | Admitting: Emergency Medicine

## 2019-09-13 DIAGNOSIS — R21 Rash and other nonspecific skin eruption: Secondary | ICD-10-CM | POA: Insufficient documentation

## 2019-09-13 DIAGNOSIS — Z7982 Long term (current) use of aspirin: Secondary | ICD-10-CM | POA: Diagnosis not present

## 2019-09-13 DIAGNOSIS — Z7984 Long term (current) use of oral hypoglycemic drugs: Secondary | ICD-10-CM | POA: Insufficient documentation

## 2019-09-13 DIAGNOSIS — J45909 Unspecified asthma, uncomplicated: Secondary | ICD-10-CM | POA: Insufficient documentation

## 2019-09-13 DIAGNOSIS — E119 Type 2 diabetes mellitus without complications: Secondary | ICD-10-CM | POA: Diagnosis not present

## 2019-09-13 DIAGNOSIS — F17210 Nicotine dependence, cigarettes, uncomplicated: Secondary | ICD-10-CM | POA: Diagnosis not present

## 2019-09-13 NOTE — ED Triage Notes (Signed)
Rash on her forehead. Her MD told her over the phone that it could be Covid related since having Covid May 6th.

## 2019-09-14 MED ORDER — HYDROCORTISONE 1 % EX CREA
TOPICAL_CREAM | Freq: Three times a day (TID) | CUTANEOUS | Status: DC
  Filled 2019-09-14: qty 28

## 2019-09-14 NOTE — ED Provider Notes (Signed)
[facility] Provider Note: Lowella Dell, MD, FACEP  CSN: [id number] MRN: [id number] ARRIVAL: 09/13/19 at 2020 ROOM: [facility]FT2/[facility]FT2   CHIEF COMPLAINT  Rash   HISTORY OF PRESENT ILLNESS  09/14/19 1:01 AM Shelly Mcclain is a 44 y.o. female who complains of an itchy papular rash of her face for the past day.  It is primarily located on her forehead.  It is not painful.  It is not severe.  She is not aware of any exposures or contacts that may have triggered this rash.  She has had some cough and other symptoms suggestive of seasonal allergies.   Past Medical History:  Diagnosis Date  . Asthma   . Bipolar disorder (HCC)   . Diabetes mellitus without complication (HCC)   . Thyroid disease     Past Surgical History:  Procedure Laterality Date  . APPENDECTOMY      No family history on file.  Social History   Tobacco Use  . Smoking status: Current Every Day Smoker    Packs/day: 0.50    Types: Cigarettes  . Smokeless tobacco: Never Used  Substance Use Topics  . Alcohol use: No  . Drug use: No    Prior to Admission medications   Medication Sig Start Date End Date Taking? Authorizing Provider  albuterol (PROAIR HFA) 108 (90 Base) MCG/ACT inhaler INHALE 2 PUFFS BY MOUTH EVERY 4 TO 6 HOURS AS NEEDED 12/11/18   [provider]  aspirin 81 MG EC tablet Take by mouth. 06/10/19   [provider]  atorvastatin (LIPITOR) 20 MG tablet Take by mouth. 04/22/19   [provider]  busPIRone (BUSPAR) 7.5 MG tablet Take by mouth. 08/01/19   [provider]  carbamazepine (EPITOL) 200 MG tablet  07/05/13   [provider]  empagliflozin (JARDIANCE) 10 MG TABS tablet Take by mouth. 12/11/18   [provider]  Fluticasone-Salmeterol (ADVAIR) 100-50 MCG/DOSE AEPB Inhale into the lungs. 11/18/16   [provider]  levothyroxine (SYNTHROID, LEVOTHROID) 75 MCG tablet Take 75 mcg by mouth daily before breakfast.    [provider]  metFORMIN (GLUCOPHAGE-XR) 500 MG 24 hr tablet Take by mouth. 12/11/18   [provider]  omeprazole (PRILOSEC) 40 MG capsule TAKE 1 CAPSULE BY MOUTH DAILY 11/18/16   [provider]  Oxcarbazepine (TRILEPTAL) 300 MG tablet Take 1 tab bid 08/01/19   [provider]  perphenazine (TRILAFON) 4 MG tablet Take by mouth. 06/27/19   [provider]  cetirizine (ZYRTEC) 10 MG tablet Take 1 tablet (10 mg total) by mouth daily. 10/31/16 09/14/19  Cristina Gong, PA-C    Allergies Other and Strawberry extract   REVIEW OF SYSTEMS  Negative except as noted here or in the History of Present Illness.   PHYSICAL EXAMINATION  Initial Vital Signs Blood pressure (!) 115/56, pulse 60, temperature 98.4 F (36.9 C), temperature source Oral, resp. rate 16, height 5\' 2"  (1.575 m), weight 102.5 kg, last menstrual period 07/31/2019, SpO2 100 %.  Examination General: Well-developed, well-nourished female in no acute distress; appearance consistent with age of record HENT: normocephalic; atraumatic Eyes: Normal appearance Neck: supple Heart: regular rate and rhythm Lungs: clear to auscultation bilaterally Abdomen: soft; nondistended; nontender; bowel sounds present Extremities: No deformity; full range of motion Neurologic: Awake, alert and oriented; motor function intact in all extremities and symmetric; no facial droop Skin: Warm and dry; fine papular rash of face without erythema:    Psychiatric: Normal mood and affect  RESULTS  Summary of this visit's results, reviewed and interpreted by myself:   EKG Interpretation  Date/Time:    Ventricular Rate:    PR Interval:    QRS Duration:   QT Interval:    QTC Calculation:   R Axis:     Text Interpretation:        Laboratory Studies: No results found for this or any previous visit (from the past 24 hour(s)). Imaging Studies: No results found.  ED COURSE and MDM  Nursing notes, initial  and subsequent vitals signs, including pulse oximetry, reviewed and interpreted by myself.  Vitals:   09/13/19 2028 09/13/19 2030 09/13/19 2232  BP:  (!) 142/70 (!) 115/56  Pulse:  75 60  Resp:  20 16  Temp:  98.4 F (36.9 C)   TempSrc:  Oral   SpO2:  100% 100%  Weight: 102.5 kg    Height: 5\' 2"  (1.575 m)     Medications  hydrocortisone cream 1 % (has no administration in time range)    The patient's rash is nonspecific.  We will treat with topical cortisone cream.  She was advised to take any over-the-counter antihistamines as needed for the itching.  PROCEDURES  Procedures   ED DIAGNOSES     ICD-10-CM   1. Rash of face  R21        Tysheena Ginzburg, Jenny Reichmann, MD 09/14/19 [phone]

## 2019-09-14 NOTE — ED Notes (Signed)
Warm blanket given

## 2019-11-18 ENCOUNTER — Ambulatory Visit: Payer: Medicare Other | Admitting: Family Medicine

## 2022-03-17 IMAGING — DX DG CHEST 1V PORT
1 series · 1 of 1 positions shown · non-contrast
Comparison: August 21, 2019

CLINICAL DATA: Lightheadedness

EXAM:
PORTABLE CHEST 1 VIEW

[chest ap]
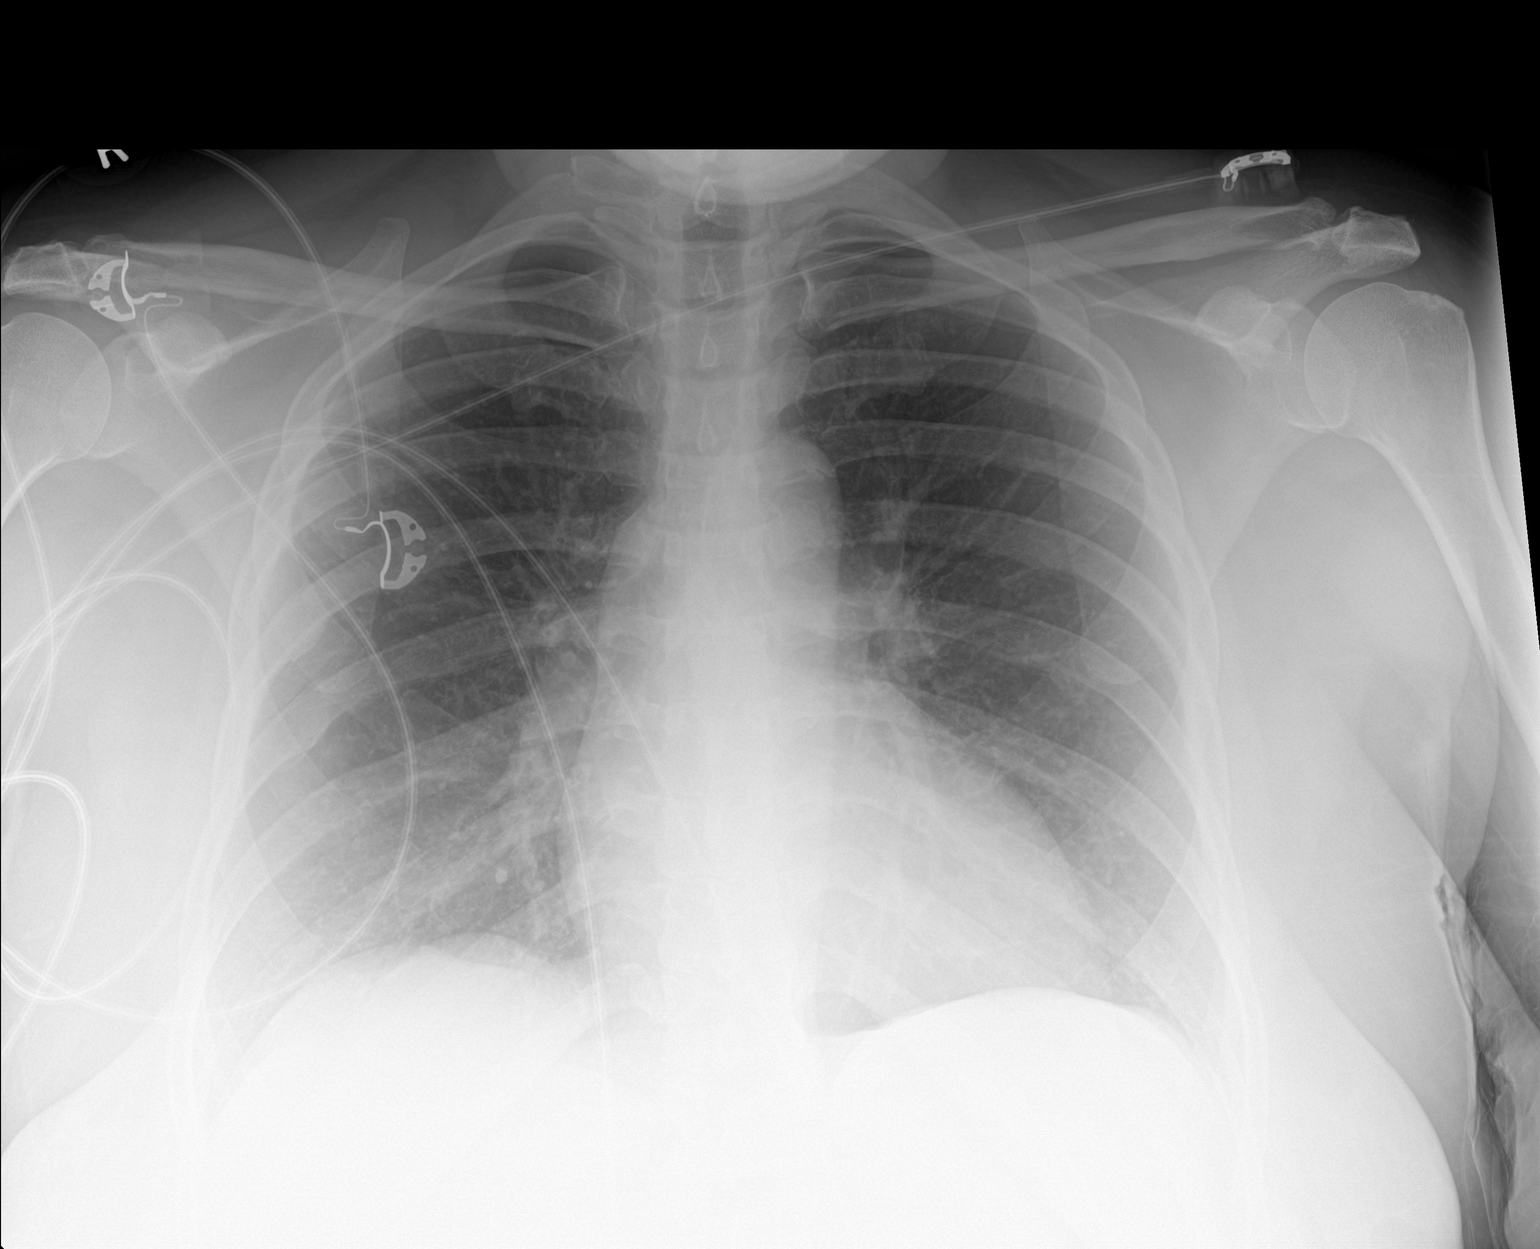

[1 of 1 positions shown; findings below may reference images not displayed]

FINDINGS: The heart size and mediastinal contours are within normal limits.
Both lungs are clear. The visualized skeletal structures are
unremarkable.
IMPRESSION: No active disease.

## 2023-04-20 ENCOUNTER — Emergency Department (HOSPITAL_BASED_OUTPATIENT_CLINIC_OR_DEPARTMENT_OTHER)
Admission: EM | Admit: 2023-04-20 | Discharge: 2023-04-20 | Disposition: A | Discharge status: Home / Self Care | Payer: 59 | Attending: Emergency Medicine | Admitting: Emergency Medicine

## 2023-04-20 ENCOUNTER — Emergency Department (HOSPITAL_BASED_OUTPATIENT_CLINIC_OR_DEPARTMENT_OTHER): Payer: 59

## 2023-04-20 ENCOUNTER — Encounter (HOSPITAL_BASED_OUTPATIENT_CLINIC_OR_DEPARTMENT_OTHER): Payer: Self-pay | Admitting: Urology

## 2023-04-20 ENCOUNTER — Other Ambulatory Visit: Payer: Self-pay

## 2023-04-20 DIAGNOSIS — J4531 Mild persistent asthma with (acute) exacerbation: Secondary | ICD-10-CM | POA: Insufficient documentation

## 2023-04-20 DIAGNOSIS — Z7984 Long term (current) use of oral hypoglycemic drugs: Secondary | ICD-10-CM | POA: Insufficient documentation

## 2023-04-20 DIAGNOSIS — E119 Type 2 diabetes mellitus without complications: Secondary | ICD-10-CM | POA: Insufficient documentation

## 2023-04-20 DIAGNOSIS — Z20822 Contact with and (suspected) exposure to covid-19: Secondary | ICD-10-CM | POA: Insufficient documentation

## 2023-04-20 DIAGNOSIS — J069 Acute upper respiratory infection, unspecified: Secondary | ICD-10-CM | POA: Diagnosis not present

## 2023-04-20 DIAGNOSIS — Z7951 Long term (current) use of inhaled steroids: Secondary | ICD-10-CM | POA: Insufficient documentation

## 2023-04-20 DIAGNOSIS — Z7982 Long term (current) use of aspirin: Secondary | ICD-10-CM | POA: Insufficient documentation

## 2023-04-20 DIAGNOSIS — R059 Cough, unspecified: Secondary | ICD-10-CM | POA: Diagnosis present

## 2023-04-20 LAB — CBC WITH DIFFERENTIAL/PLATELET
Abs Immature Granulocytes: 0.14 10*3/uL — ABNORMAL HIGH (ref 0.00–0.07)
Basophils Absolute: 0 10*3/uL (ref 0.0–0.1)
Basophils Relative: 0 %
Eosinophils Absolute: 0 10*3/uL (ref 0.0–0.5)
Eosinophils Relative: 0 %
HCT: 37.6 % (ref 36.0–46.0)
Hemoglobin: 12 g/dL (ref 12.0–15.0)
Immature Granulocytes: 1 %
Lymphocytes Relative: 21 %
Lymphs Abs: 2.7 10*3/uL (ref 0.7–4.0)
MCH: 28.8 pg (ref 26.0–34.0)
MCHC: 31.9 g/dL (ref 30.0–36.0)
MCV: 90.4 fL (ref 80.0–100.0)
Monocytes Absolute: 0.7 10*3/uL (ref 0.1–1.0)
Monocytes Relative: 6 %
Neutro Abs: 9.2 10*3/uL — ABNORMAL HIGH (ref 1.7–7.7)
Neutrophils Relative %: 72 %
Platelets: 239 10*3/uL (ref 150–400)
RBC: 4.16 MIL/uL (ref 3.87–5.11)
RDW: 14.8 % (ref 11.5–15.5)
WBC: 12.8 10*3/uL — ABNORMAL HIGH (ref 4.0–10.5)
nRBC: 0 % (ref 0.0–0.2)

## 2023-04-20 LAB — RESP PANEL BY RT-PCR (RSV, FLU A&B, COVID)  RVPGX2
Influenza A by PCR: NEGATIVE
Influenza B by PCR: NEGATIVE
Resp Syncytial Virus by PCR: NEGATIVE
SARS Coronavirus 2 by RT PCR: NEGATIVE

## 2023-04-20 LAB — COMPREHENSIVE METABOLIC PANEL
ALT: 25 U/L (ref 0–44)
AST: 21 U/L (ref 15–41)
Albumin: 4 g/dL (ref 3.5–5.0)
Alkaline Phosphatase: 62 U/L (ref 38–126)
Anion gap: 10 (ref 5–15)
BUN: 10 mg/dL (ref 6–20)
CO2: 26 mmol/L (ref 22–32)
Calcium: 9.5 mg/dL (ref 8.9–10.3)
Chloride: 99 mmol/L (ref 98–111)
Creatinine, Ser: 0.87 mg/dL (ref 0.44–1.00)
GFR, Estimated: >60 mL/min (ref >60)
Glucose, Bld: 213 mg/dL — ABNORMAL HIGH (ref 70–99)
Potassium: 4.1 mmol/L (ref 3.5–5.1)
Sodium: 135 mmol/L (ref 135–145)
Total Bilirubin: 0.5 mg/dL (ref 0.0–1.2)
Total Protein: 7.6 g/dL (ref 6.5–8.1)

## 2023-04-20 LAB — TROPONIN I (HIGH SENSITIVITY)
Troponin I (High Sensitivity): 7 ng/L (ref <18)
Troponin I (High Sensitivity): 9 ng/L (ref <18)

## 2023-04-20 MED ORDER — MAGNESIUM SULFATE 2 GM/50ML IV SOLN
2.0000 g | Freq: Once | INTRAVENOUS | Status: AC
  Administered 2023-04-20: 2 g via INTRAVENOUS
  Filled 2023-04-20: qty 50

## 2023-04-20 MED ORDER — IPRATROPIUM-ALBUTEROL 0.5-2.5 (3) MG/3ML IN SOLN
3.0000 mL | RESPIRATORY_TRACT | Status: AC
Start: 2023-04-20 — End: 2023-04-20
  Administered 2023-04-20 (×3): 3 mL via RESPIRATORY_TRACT
  Filled 2023-04-20: qty 9

## 2023-04-20 MED ORDER — AZITHROMYCIN 250 MG PO TABS: ORAL_TABLET | ORAL | 0 refills | Status: AC

## 2023-04-20 MED ORDER — METHYLPREDNISOLONE SODIUM SUCC 125 MG IJ SOLR
125.0000 mg | Freq: Once | INTRAMUSCULAR | Status: AC
  Administered 2023-04-20: 125 mg via INTRAVENOUS
  Filled 2023-04-20: qty 2

## 2023-04-20 MED ORDER — IOHEXOL 350 MG/ML SOLN
100.0000 mL | Freq: Once | INTRAVENOUS | Status: AC | PRN
  Administered 2023-04-20: 100 mL via INTRAVENOUS

## 2023-04-20 MED ORDER — HYDROCODONE-ACETAMINOPHEN 7.5-325 MG/15ML PO SOLN: 15.0000 mL | Freq: Four times a day (QID) | ORAL | 0 refills | Status: AC | PRN

## 2023-04-20 NOTE — Discharge Instructions (Signed)
 As we discussed, your workup in the ER today was reassuring for acute findings.  Laboratory evaluation, x-ray, and CT imaging did not reveal any emergent cause of your symptoms.  I recommend that you complete the course of steroids provided by your primary care doctor.  I have also given you a prescription for codeine cough syrup for you to take as prescribed as needed for management of your cough and pain.  Do not drive or operate heavy machinery while taking this medication as it can be sedating.  I have also given you a prescription for azithromycin  to cover for any bacterial cause of infection.  Please take this as prescribed in its entirety.  Please schedule close follow-up appointment with your primary care doctor.  Return if development of any new or worsening symptoms.

## 2023-04-20 NOTE — ED Triage Notes (Signed)
 Pt states dry cough x 3 weeks and SOB  Using inhalers, last 45 min ago   State h/o asthma  Steve, RT at bedside, LS clear but diminished in all lobes

## 2023-04-20 NOTE — ED Provider Notes (Signed)
 [place] [facility] AT [facility] Provider Note   CSN: [id number] Arrival date & time: 04/20/23  1428     History  Chief Complaint  Patient presents with   Cough    Shelly Mcclain is a 48 y.o. female.  Patient with history of asthma, diabetes, thyroid disease presents today with complaints of cough, congestion, chest pain, and shortness of breath. She states that same began 3 weeks ago and has been persistent since then. She states that her symptoms started as URI symptoms with congestion, rhinorrhea, and fevers, however after a few days these symptoms resolved and she was left with a persistent cough. She states that her chest feels tight and she is having dyspnea on exertion. Denies history of similar symptoms previously. No PND or orthopnea.  No leg pain or leg swelling. No recent travel or surgeries. No history of blood clots or malignancy.  Does state that she has asthma and has been using her inhaler with minimal improvement.  She has also been prescribed a round of oral steroids with her primary care doctor.  She started these only a couple days ago. Denies ever requiring hospital intervention for her asthma previously. No nausea or vomiting.   The history is provided by the patient. No language interpreter was used.  Cough Associated symptoms: shortness of breath        Home Medications Prior to Admission medications   Medication Sig Start Date End Date Taking? Authorizing Provider  albuterol  (PROAIR  HFA) 108 (90 Base) MCG/ACT inhaler INHALE 2 PUFFS BY MOUTH EVERY 4 TO 6 HOURS AS NEEDED 12/11/18   [provider]  aspirin  81 MG EC tablet Take by mouth. 06/10/19   [provider]  atorvastatin  (LIPITOR) 20 MG tablet Take by mouth. 04/22/19   [provider]  busPIRone  (BUSPAR ) 7.5 MG tablet Take by mouth. 08/01/19   [provider]  carbamazepine (EPITOL) 200 MG tablet  07/05/13   [provider]  empagliflozin  (JARDIANCE) 10 MG TABS tablet Take by mouth. 12/11/18   [provider]  Fluticasone -Salmeterol (ADVAIR) 100-50 MCG/DOSE AEPB Inhale into the lungs. 11/18/16   [provider]  levothyroxine  (SYNTHROID , LEVOTHROID) 75 MCG tablet Take 75 mcg by mouth daily before breakfast.    [provider]  metFORMIN (GLUCOPHAGE-XR) 500 MG 24 hr tablet Take by mouth. 12/11/18   [provider]  omeprazole (PRILOSEC) 40 MG capsule TAKE 1 CAPSULE BY MOUTH DAILY 11/18/16   [provider]  Oxcarbazepine  (TRILEPTAL ) 300 MG tablet Take 1 tab bid 08/01/19   [provider]  perphenazine  (TRILAFON ) 4 MG tablet Take by mouth. 06/27/19   [provider]  cetirizine  (ZYRTEC ) 10 MG tablet Take 1 tablet (10 mg total) by mouth daily. 10/31/16 09/14/19  Windle Almarie ORN, PA-C      Allergies    Other and Strawberry extract    Review of Systems   Review of Systems  HENT:  Positive for congestion.   Respiratory:  Positive for cough and shortness of breath.   All other systems reviewed and are negative.   Physical Exam Updated Vital Signs BP (!) 150/88   Pulse (S) 96 Comment: after ambulating  Temp 98.3 F (36.8 C) (Oral)   Resp (!) 27   Ht 5' 2 (1.575 m)   Wt 102.5 kg   SpO2 100%   BMI 41.33 kg/m  Physical Exam Vitals and nursing note reviewed.  Constitutional:      General: She  is not in acute distress.    Appearance: Normal appearance. She is normal weight. She is not ill-appearing, toxic-appearing or diaphoretic.  HENT:     Head: Normocephalic and atraumatic.  Cardiovascular:     Rate and Rhythm: Normal rate and regular rhythm.     Heart sounds: Normal heart sounds.  Pulmonary:     Effort: Pulmonary effort is normal. No respiratory distress.     Comments: Diminished lung sounds in the bilateral lung bases Abdominal:     General: Abdomen is flat.     Palpations: Abdomen is soft.     Tenderness: There is no abdominal tenderness.   Musculoskeletal:        General: No tenderness. Normal range of motion.     Cervical back: Normal range of motion and neck supple.     Right lower leg: No edema.     Left lower leg: No edema.  Skin:    General: Skin is warm and dry.  Neurological:     General: No focal deficit present.     Mental Status: She is alert.  Psychiatric:        Mood and Affect: Mood normal.        Behavior: Behavior normal.     ED Results / Procedures / Treatments   Labs (all labs ordered are listed, but only abnormal results are displayed) Labs Reviewed  CBC WITH DIFFERENTIAL/PLATELET - Abnormal; Notable for the following components:      Result Value   WBC 12.8 (*)    Neutro Abs 9.2 (*)    Abs Immature Granulocytes 0.14 (*)    All other components within normal limits  COMPREHENSIVE METABOLIC PANEL - Abnormal; Notable for the following components:   Glucose, Bld 213 (*)    All other components within normal limits  RESP PANEL BY RT-PCR (RSV, FLU A&B, COVID)  RVPGX2  TROPONIN I (HIGH SENSITIVITY)  TROPONIN I (HIGH SENSITIVITY)    EKG None  Radiology CT Angio Chest PE W and/or Wo Contrast Result Date: 04/20/2023 CLINICAL DATA:  Pulmonary embolism (PE) suspected, high prob EXAM: CT ANGIOGRAPHY CHEST WITH CONTRAST TECHNIQUE: Multidetector CT imaging of the chest was performed using the standard protocol during bolus administration of intravenous contrast. Multiplanar CT image reconstructions and MIPs were obtained to evaluate the vascular anatomy. RADIATION DOSE REDUCTION: This exam was performed according to the departmental dose-optimization program which includes automated exposure control, adjustment of the mA and/or kV according to patient size and/or use of iterative reconstruction technique. CONTRAST:  OMNIPAQUE  IOHEXOL  350 MG/ML SOLN COMPARISON:  08/12/2022. FINDINGS: Cardiovascular: Satisfactory opacification of the pulmonary arteries to the segmental level. No evidence of pulmonary  embolism. Normal heart size. No pericardial effusion. Mediastinum/Nodes: No enlarged mediastinal, hilar, or axillary lymph nodes. Thyroid gland, trachea, and esophagus demonstrate no significant findings. Lungs/Pleura: Lungs are clear. No pleural effusion or pneumothorax. Upper Abdomen: No acute abnormality.  Hepatomegaly is noted. Musculoskeletal: No chest wall abnormality. No acute or significant osseous findings. Review of the MIP images confirms the above findings. IMPRESSION: No evidence of PE, aneurysm or dissection. Electronically Signed   By: Fonda Field M.D.   On: 04/20/2023 22:38   DG Chest 2 View Result Date: 04/20/2023 CLINICAL DATA:  Shortness of breath and cough for 3 weeks EXAM: CHEST - 2 VIEW COMPARISON:  Chest x-ray August 12, 2022 FINDINGS: The cardiomediastinal silhouette is unchanged in contour. Bilateral diffuse interstitial prominence and bronchial wall thickening. Left basilar bandlike opacities. No pleural effusion or  pneumothorax. The visualized upper abdomen is unremarkable. No acute osseous abnormality. IMPRESSION: 1. Bilateral diffuse interstitial prominence and bronchial wall thickening, suggestive of reactive airways disease. 2. Left basilar bandlike opacities, likely atelectasis. However, superimposed pneumonia is not excluded. Electronically Signed   By: Dirk Arrant M.D.   On: 04/20/2023 16:38    Procedures Procedures    Medications Ordered in ED Medications  magnesium  sulfate IVPB 2 g 50 mL (2 g Intravenous New Bag/Given 04/20/23 2021)  ipratropium-albuterol  (DUONEB) 0.5-2.5 (3) MG/3ML nebulizer solution 3 mL (3 mLs Nebulization Given 04/20/23 1843)  methylPREDNISolone  sodium succinate (SOLU-MEDROL ) 125 mg/2 mL injection 125 mg (125 mg Intravenous Given 04/20/23 2017)    ED Course/ Medical Decision Making/ A&P Clinical Course as of 04/20/23 2328  Thu Apr 20, 2023  1916 Pulse Rate(S): 96 [SS]    Clinical Course User Index [SS] Lariza Cothron, Lauraine LABOR, PA-C                                  Medical Decision Making Amount and/or Complexity of Data Reviewed Labs: ordered. Radiology: ordered.  Risk Prescription drug management.   This patient is a 48 y.o. female who presents to the ED for concern of cough, chest tightness, shortness of breath, this involves an extensive number of treatment options, and is a complaint that carries with it a high risk of complications and morbidity. The emergent differential diagnosis prior to evaluation includes, but is not limited to, URI, CHF, pericardial effusion/tamponade, arrhythmias, ACS, COPD, asthma, bronchitis, pneumonia, pneumothorax, PE, anemia   This is not an exhaustive differential.   Past Medical History / Co-morbidities / Social History:  has a past medical history of Asthma, Bipolar disorder (HCC), Diabetes mellitus without complication (HCC), and Thyroid disease.  Additional history: Chart reviewed.  Physical Exam: Physical exam performed. The pertinent findings include: lung sounds diminished in bilateral bases, persistent dry cough, no wheezing  Lab Tests: I ordered, and personally interpreted labs.  The pertinent results include:  WBC 12.8, likely due to steroid use. Glucose 213, likely due to steroid use. Troponin WNL.   Imaging Studies: I ordered imaging studies including CXR, CTA PE. I independently visualized and interpreted imaging which showed   CXR:  1. Bilateral diffuse interstitial prominence and bronchial wall thickening, suggestive of reactive airways disease. 2. Left basilar bandlike opacities, likely atelectasis. However, superimposed pneumonia is not excluded.   CTA:  No evidence of PE, aneurysm or dissection.   I agree with the radiologist interpretation.   Medications: I ordered medication including duonebs x 3, solu-medrol , magnesium   for shortness of breath/asthma. Reevaluation of the patient after these medicines showed that the patient improved. I have reviewed the  patients home medicines and have made adjustments as needed.   Disposition: After consideration of the diagnostic results and the patients response to treatment, I feel that emergency department workup does not suggest an emergent condition requiring admission or immediate intervention beyond what has been performed at this time. The plan is: discharge with codeine cough syrup, z pack to cover for superimposed infection, recommendations to finish the course of steroids provided by her pcp with close outpatient follow-up and return precautions.  After nebulizer treatments, patient still felt dyspneic and lightheaded with ambulation.  However after IV steroids and magnesium , she does note that she feels a lot better.  She feels ready to go home.  Her workup is overall benign.  She is able  to walk without any drop in her oxygen saturation.  PDMP reviewed.  Patient advised not to drive or operate heavy machinery while taking codeine cough syrup.  Evaluation and diagnostic testing in the emergency department does not suggest an emergent condition requiring admission or immediate intervention beyond what has been performed at this time.  Plan for discharge with close PCP follow-up.  Patient is understanding and amenable with plan, educated on red flag symptoms that would prompt immediate return.  Patient discharged in stable condition.  Final Clinical Impression(s) / ED Diagnoses Final diagnoses:  Upper respiratory tract infection, unspecified type  Mild persistent asthma with exacerbation    Rx / DC Orders ED Discharge Orders          Ordered    HYDROcodone -acetaminophen  (HYCET) 7.5-325 mg/15 ml solution  4 times daily PRN        04/20/23 2334    azithromycin  (ZITHROMAX  Z-PAK) 250 MG tablet        04/20/23 2334          An After Visit Summary was printed and given to the patient.     Nora Lauraine DELENA DEVONNA 04/20/23 2336    Pamella Ozell DELENA, DO 04/26/23 1353

## 2023-04-20 NOTE — ED Notes (Signed)
   04/20/23 1437  Respiratory Assessment  $ RT Protocol Assessment  Yes  Assessment Type Assess only  Respiratory Pattern Regular;Symmetrical;Dyspnea with exertion  Chest Assessment Chest expansion symmetrical  Cough Productive (at times)  Sputum Specimen Source Spontaneous cough  Bilateral Breath Sounds Clear;Diminished  Oxygen Therapy/Pulse Ox  O2 Therapy Room air   Reports using MDI ~45 mins PTA

## 2023-04-20 NOTE — ED Notes (Signed)
   04/20/23 1847  Aerosol Therapy Tx  Post-Treatment Pulse 83  Post-Treatment Respirations 24  Treatment Tolerance Tolerated well  RT Breath Sounds  Bilateral Breath Sounds Clear;Diminished  Oxygen Therapy/Pulse Ox  O2 Therapy Room air   Patient given Aerochamber to use with current MDI.  Patient instruction given.

## 2023-04-20 NOTE — ED Notes (Signed)
 Ambulated in hallway, SpO2 96-100% on r/a, patient endorses increased work of breathing a light headedness.  04/20/23 1839  Resting  Supplemental oxygen during test? No  Resting Heart Rate 73  Resting Sp02 99  Lap 1 (250 feet)  HR 96 (+DOE)  02 Sat 96 (NPC/ vitals updated after ambulation)

## 2023-11-16 NOTE — Discharge Summary (Signed)
 ------------------------------------------------------------------------------- Attestation signed by Robin Bartley Has, MD at 11/16/2023 12:42 PM I have reviewed the notes, assessments, and/or procedures performed by Mardell Gibes PA student, I concur with her/his documentation of Shelly Mcclain.  -------------------------------------------------------------------------------     Psychiatry Discharge Summary   Admit date: 11/10/2023  2:26 PM  Discharge date and time: No discharge date for patient encounter.  Discharge Physician: Mardell Gibes, Medical Student, DO, ROBIN SAILOR (Attending Physician)  Time Spent on Discharge Summary: 65 Minutes  Hospital Course   Del Shelly Mcclain is a 48 y.o. with a current psychiatric diagnosis of schizoaffective disorder, bipolar type and BPD, GAD, and acute psychosis who was brought to the ED by her sister due to being noncompliant with her psychiatric medications and experiencing hallucinations.   Per [facility] ED consult  Pt presents unaccompanied after being transported by her sister. Medical record indicates Pt has a diagnosis of schizoaffective disorder, bipolar type. During assessment, she has difficulty responding to questions and appears to be distracted by internal stimuli. She says, I don't feel right. She states, I have boric acid in my privates and on my face and says a woman put it there. She says she has not taken psychiatric medications in approximately three months and cannot provide an explanation why she stopped. She cannot remember the names of the medications. She says she has not slept in 3-4 days, that she has a lot of energy, and is up all night. She says she is hearing the voices of several people and that she has conversations with them, adding we talk about everything. Pt's lips are moving as though speaking and at times she grabs her jaw. She acknowledges episodes of visual hallucinations, which she can only describe as stuff. When  asked if she is having suicidal thoughts, Pt begins to cry and nods her head yes. She does not appear to have a plan. She denies history of suicide attempts. She denies homicidal ideation or history of violence. She denies alcohol or other substance use.  Pt says she lives alone. Per medical record, Pt receives disability for mental health and does not have a legal guardian. She cannot remember the name of her psychiatric provider and says she goes to [facility] in [place], however an agency with that name is not listed online. She denies legal problems. She denies access to firearms.Medical record indicates Pt presented to the emergency department twice yesterday and twice today for medical concerns. She has a history of multiple presentations for medical complaints prior to acute psychotic episodes. Per Pt and medical record, she was last psychiatrically hospitalized in 2021 at [facility]. Pt is dressed in hospital scrubs, alert and oriented person, place and situation. Pt speaks in a clear tone, at moderate volume, and delayed pace. Motor behavior appears normal. Eye contact is good and she is tearful at times. Pt's mood is depressed and anxious. Affect is constricted. Thought process is disorganized with delusional content. Pt appears to be responding to internal stimuli and is observed talking to people not in the room. She is cooperative and follows directions.  On 11/10/2023  2:26 PM this patient was involuntarily admitted to the psychiatric unit and placed on suicide and elopement precautions. The patient was seen daily and case discussed in a multidisciplinary team meeting.   During the course of the hospitalization,  Schizoaffective disorder, bipolar type Patient presented with acute decompensation in the context of medication noncompliance.  Given this, patient initiated on Invega Sustenna as well as Depakote. -  First dose of Invega Sustenna 234 mg 7/27, paliperidone 6 mg p.o. daily - 7/27:  Depakote 500 mg BID initiated -  7/31: Invega sustenna 156mg  IM q 28 days given  At time of discharge, patient denies SI, HI, AVH and is able to contract for safety. Patient denies any medication side effects. She states that her mood is moving. Appetite is good and that she is ready to go home. Patient stated she is not sure how much she slept but nursing reported she slept 11 hours.   The following medications were started/changed during the hospitalization:  The following home medications were continued: N/A - The following home medications were not continued: N/A - The following new medications were initiated: N/A - 7/27: Ativan 1 mg BID, Paliperidone 6 mg PO daily, Invega Sustenna 234 mg, Depakote 500 mg BID - 7/28: No changes  - 7/29: No changes 7/ 31: invega sustenna 156mg  IM q 28 days ordered    Behavior while hospitalized: pleasant and guarded PRN medications required: alum-mag hydroxide-simethicone (MAALOX, MYLANTA) 200-200-20 mg/5 mL suspension 30 mL Medication compliance: Yes Group therapy attendance: No  Medical issues addressed during this admission: N/A Admission labs revealed the following pertinent positives:  CBC: wnl CMP: Unremarkable   Alcohol Biomarkers: MCV: wnl/GGT: none/AST: wnl/ALT: wnl UDS: Negative Urine Fentanyl: Negative Alcohol: <10   Hgb A1c: 5.9  Lipid Profile: Cholesterol 217, HDL 48, LDL 145 TSH: 21.807, Free T4 wnl EKG Qtc: 462   On 11/16/23 the treatment team decided to discharge Shelly Mcclain home with scheduled outpatient mental health follow up appointments. Risks, benefits and side effects of medications were discussed in detail prior to discharge. Shelly Mcclain was duly informed to call 911 or go to the nearest emergency department if patient becomes overtly distressed or develops suicidal, homicidal or other violent ideation and they verbalized understanding.   Follow-up   Discharge Follow-Up Plan:  Clinical  Follow-up (MH and/or SA Services):  Name of Provider Agency Referred: [facility] Date/Time of Appointment:  ACT team will f/u with patient within 24 hours of discharge Phone Number: [phone] Address:  [address] [place], [place] [address] Reason: ACT services  Transportation:    **Please bring photo ID, social security card, insurance card, or proof of household income if they do not have insurance.**  Crisis Support Line: [phone]   Discharge Screening   Multiple Antipsychotics:  NO  FDA-approved cessation medication prescribed at discharge: Nicotine Gum Note: if patient is heavy user, (average volume of five or more cigarettes per day and/or cigars daily and/or pipes daily during the past 30 days), the patient is required to have FDA-approved cessation medication (nicotine patch/gum, bupropion, varenicline etc),  or a documented reason for not prescribing it.   Labs/Imaging During Admission   Is the patient currently on an antipsychotic or will possibly be discharged on an antipsychotic?  YES  Results for orders placed or performed during the hospital encounter of 11/10/23  Ethanol  Result Value Ref Range   Ethanol <10 <10 mg/dL  Drug of Abuse 7 Panel  Result Value Ref Range   Amphetamines Screen, Urine Negative Negative   Barbiturates Screen, Urine Negative Negative   Benzodiazepines Screen, Urine Negative Negative   Cocaine Screen, Urine Negative Negative   Opiates Screen, Urine Negative Negative   Fentanyl Screen, Urine Negative Negative   Marijuana (THC) Screen, Urine Negative Negative   Creatinine, Urine 471 >=20 mg/dL  Urinalysis with Reflex to Microscopic  Result Value Ref Range   Color, Urine Yellow Yellow   Clarity, Urine Ex. Turbid (A) Clear   Specific Gravity, Urine 1.031 (H) 1.005 - 1.025   pH, Urine 5.5 5.0 - 8.0   Protein, Urine 50 (A) Negative, 10 , 20  mg/dL   Glucose, Urine Negative Negative, 30 , 50  mg/dL   Ketones,  Urine 60 (A) Negative, Trace mg/dL   Bilirubin, Urine Negative Negative   Blood, Urine Negative Negative, Trace   Nitrite, Urine Negative Negative   Leukocyte Esterase, Urine Negative Negative, 25   Urobilinogen, Urine 2.0 (A) <2.0 mg/dL   WBC, Urine 86-79 (A) <6 /HPF   RBC, Urine 3-5 (A) 0 - 2 /HPF   Bacteria, Urine Rare None Seen, Rare /HPF   Squamous Epithelial Cells, Urine >30 (A) 0 - 5 /HPF   Renal Epithelial Cells, Urine 0-2 0 - 2 /HPF   Hyaline Casts, Urine >30 (A) 0 - 2 /LPF   Calcium  Oxalate Crystals, Urine Present (A) None Seen /HPF  Comprehensive Metabolic Panel  Result Value Ref Range   Sodium 143 136 - 145 mmol/L   Potassium 4.3 3.4 - 4.5 mmol/L   Chloride 109 (H) 98 - 107 mmol/L   CO2 22 21 - 31 mmol/L   Anion Gap 12 6 - 14 mmol/L   Glucose, Random 137 (H) 70 - 99 mg/dL   Blood Urea Nitrogen (BUN) 15 7 - 25 mg/dL   Creatinine 8.80 9.39 - 1.20 mg/dL   eGFR 56 (L) >40 fO/fpw/8.26f7   Albumin 4.9 3.5 - 5.7 g/dL   Total Protein 8.4 6.4 - 8.9 g/dL   Bilirubin, Total 0.7 0.3 - 1.0 mg/dL   Alkaline Phosphatase (ALP) 63 34 - 104 U/L   Aspartate Aminotransferase (AST) 21 13 - 39 U/L   Alanine Aminotransferase (ALT) 12 7 - 52 U/L   Calcium  10.5 (H) 8.6 - 10.3 mg/dL   BUN/Creatinine Ratio    CBC with Differential  Result Value Ref Range   WBC 9.08 4.40 - 11.00 10*3/uL   RBC 4.62 4.10 - 5.10 10*6/uL   Hemoglobin 13.1 12.3 - 15.3 g/dL   Hematocrit 60.3 64.0 - 44.6 %   Mean Corpuscular Volume (MCV) 85.7 80.0 - 96.0 fL   Mean Corpuscular Hemoglobin (MCH) 28.4 27.5 - 33.2 pg   Mean Corpuscular Hemoglobin Conc (MCHC) 33.2 33.0 - 37.0 g/dL   Red Cell Distribution Width (RDW) 17.2 (H) 12.3 - 17.0 %   Platelet Count (PLT) 246 150 - 450 10*3/uL   Mean Platelet Volume (MPV) 9.6 6.8 - 10.2 fL   Neutrophils % 74 %   Lymphocytes % 19 %   Monocytes % 7 %   Eosinophils % 0 %   Basophils % 0 %   Neutrophils Absolute 6.70 1.80 - 7.80 10*3/uL   Lymphocytes # 1.70 1.00 - 4.80 10*3/uL    Monocytes # 0.60 0.00 - 0.80 10*3/uL   Eosinophils # 0.00 0.00 - 0.50 10*3/uL   Basophils # 0.00 0.00 - 0.20 10*3/uL  TSH  Result Value Ref Range   TSH 21.807 (H) 0.450 - 5.330 uIU/mL  T4, Free  Result Value Ref Range   T4, Free 0.6 0.6 - 1.1 ng/dL  Hemoglobin J8R With Estimated Average Glucose  Result Value Ref Range   Hemoglobin A1c 5.9 (H) <5.7 %   Estimated Average Glucose 123 70 - 154 mg/dL  Lipid Panel  Result Value Ref Range   Cholesterol, Total, Lipid Panel 217 (H) <200 mg/dL  Triglycerides, Lipid Panel 121 <150 mg/dL   HDL Cholesterol - Lipid Panel 48 (L) >=60 mg/dL   LDL Cholesterol, Calculated 145 (H) <100 mg/dL   Non-HDL Cholesterol 830 mg/dL  Human Chorionic Gonadotropin (HCG), Qualitative, Urine  Result Value Ref Range   HCG, Qualitative, Urine Negative Negative  Valproic Acid  Result Value Ref Range   Valproic Acid 4 (L) 50 - 100 ug/mL  Valproic Acid  Result Value Ref Range   Valproic Acid 11 (L) 50 - 100 ug/mL     Discharge Mental Status Examination   Mental Status Examination: General Appearance normal body habitus, casually dressed, and hygiene appropriate  General Behavior cooperative, pleasant, guarded, and appropriate eye contact  Psychomotor Activity normoactive  Gait and Station no gait abnormalities  Speech   fluent, normal volume, normal tone, normal prosody, normal amount, and loud  Mood   moving  Affect    congruent and irritable  Thought Process disorganized  Associations intact  Thought Content/Perceptual Disturbances Denies suicidal/homicidal ideation and auditory/visual hallucinations.  Cognition/Sensorium  orientation grossly intact, memory intact, attention intact, language normal, and fund of knowledge grossly intact  Insight  fair  Judgment fair   Discharge Diagnoses   Psychiatric Diagnoses: Schizoaffective Disorder, Bipolar Type (F25.0) (Primary Diagnosis)   Medical Diagnoses: Medical History[1]  Psychosocial and  contextual factors: economic problems, housing problems, occupational problems, other psychosocial or environmental problems, problems related to social environment, and problems with access to health care services  C-SSRS ([place] Suicide Severity Risk Scale and Protective Factors)   Since Last Asked Suicide Risk (Since Last Asked) Assessment - ongoing 1. Have you wished you were dead or wished you could go to sleep and not wake up? (Since Last Asked): No 2. Have you actually had any thoughts of killing yourself? (Since Last Asked): No Details: Thoughts of killing yourself (Since Last Asked): (S) Patient was asking someone she was hallucinating to strangle her. 3. Have you been thinking about how you might do this? (Since Last Asked): No 4. Have you had these thoughts and had some intention of acting on them? (Since Last Asked): No 5. Have you started to work out or worked out the details of how to kill yourself? Do you intend to carry out this plan? (Since Last Asked): No 6. Have you done anything, started to do anything, or prepared to do anything to end your life? (Since Last Asked): No Calculated C-SSRS Risk Score (Since Last Asked): No Risk Indicated  Patient Instructions     Discharge Medications     New Medications      Sig Disp Refill Start End  nicotine polacrilex 2 mg gum Commonly known as: NICORETTE  Chew 1 each (2 mg total) every hour as needed for smoking cessation Indications: stop smoking.  100 each  0     paliperidone palmitate 156 mg/mL Syrg injection Commonly known as: INVEGA SUSTENNA  Inject 1 mL (156 mg total) into the muscle every 28 days.  3 mL  0  December 17, 2023        Modified Medications      Sig Disp Refill Start End  ProAir  HFA 90 mcg/actuation inhaler Generic drug: albuterol  HFA What changed: Another medication with the same name was removed. Continue taking this medication, and follow the directions you see here.  INHALE 2 PUFFS BY MOUTH  EVERY 4 TO 6 HOURS AS NEEDED  8.5 g  5         Medications To Continue  Sig Disp Refill Start End  atorvastatin  20 mg tablet Commonly known as: LIPITOR  Take 20 mg by mouth daily.   0     dilTIAZem 120 mg 24 hr capsule Commonly known as: CARDIZEM CD  Take 1 capsule (120 mg total) by mouth daily.  30 capsule  6     divalproex 500 mg extended release tablet Commonly known as: DEPAKOTE  Take 500 mg by mouth 2 (two) times a day.   0     fluticasone  propion-salmeteroL 100-50 mcg/dose diskus inhaler Commonly known as: ADVAIR DISKUS  Inhale 1 puff 2 (two) times a day.  1 Inhaler  5     glucose monitoring kit Kit  Use as instructed  1 each  0     levothyroxine  137 mcg tablet Commonly known as: SYNTHROID   Take 137 mcg by mouth daily.   0     metFORMIN 500 mg 24 hr tablet Commonly known as: GLUCOPHAGE-XR  Take 500 mg by mouth daily with breakfast.  90 tablet  1     Mounjaro 5 mg/0.5 mL subcutaneous pen injector Generic drug: tirzepatide  Inject 5 mg under the skin every Wednesday.   0     omeprazole 40 mg DR capsule Commonly known as: PriLOSEC  TAKE 1 CAPSULE BY MOUTH DAILY  90 capsule  1     Precision Q-I-D Test test strip Generic drug: glucose blood  Use a clean strip to check fasting blood glucose once each morning.  50 strip  2         Stopped Medications    benzonatate 100 mg capsule Commonly known as: TESSALON   Breztri Aerosphere 160-9-4.8 mcg/actuation inhaler Generic drug: budesonide-glycopyr-formoterol   busPIRone  7.5 mg tablet Commonly known as: BUSPAR    Caplyta 42 mg capsule Generic drug: lumateperone   cetirizine  10 mg tablet Commonly known as: ZyrTEC    Clarinex-D 12 HOUR 2.5-120 mg Tm12 Generic drug: desloratadine-pseudoephedrine   clonazePAM 1 mg tablet Commonly known as: KlonoPIN   clotrimazole-betamethasone 1-0.05 % cream Commonly known as: LOTRISONE   cyclobenzaprine  10 mg tablet Commonly known as: FLEXERIL     cyclobenzaprine  5 mg tablet Commonly known as: FLEXERIL    diclofenac sodium 1 % gel Commonly known as: VOLTAREN   fluconazole 150 mg tablet Commonly known as: DIFLUCAN   fluPHENAZine 5 mg tablet Commonly known as: PROLIXIN   fluticasone  propionate 50 mcg/spray nasal spray Commonly known as: FLONASE   guaiFENesin  100 mg/5 mL syrup Commonly known as: ROBITUSSIN   losartan 25 mg tablet Commonly known as: COZAAR   methocarbamoL 500 mg tablet Commonly known as: ROBAXIN   montelukast 10 mg tablet Commonly known as: SINGULAIR   Novofine 32 32 gauge x 1/4 Ndle Generic drug: pen needle, diabetic   OneTouch Delica Plus Lancet 33 gauge Misc Generic drug: lancets   sertraline 50 mg tablet Commonly known as: MARQUIS Mardell Gibes, Medical Student 11/16/2023 10:45 AM [facility] Department of Psychiatry & Behavioral Medicine          [1] Past Medical History: Diagnosis Date  . Acquired hypothyroidism   . Acute lateral meniscal injury of left knee   . Acute medial meniscus tear of right knee   . Acute psychosis    (CMD)   . Arthritis of right acromioclavicular joint 10/28/2021  . Benzodiazepine dependence    (CMD)   . Benzodiazepine overdose of undetermined intent   . Borderline personality disorder    (  CMD)   . Chondromalacia of knee, right   . COVID-19 virus infection    April 2021  . Eczema   . Essential hypertension   . GAD (generalized anxiety disorder)   . Gastroesophageal reflux disease without esophagitis   . History of CVA (cerebrovascular accident)    heat stroke  . Impingement syndrome of right shoulder 10/28/2021  . Irregular heart beat   . Menometrorrhagia   . Mild persistent asthma without complication (CMD)   . Noncompliance with treatment   . Obesity due to excess calories with serious comorbidity   . Palpitations   . Paranoid schizophrenia    (CMD) 06/2013  . Perennial allergic rhinitis with  seasonal variation   . Persistent adjustment disorder with mixed anxiety and depressed mood   . Schizoaffective disorder, bipolar type    (CMD) 06/2013  . Seizures    (CMD)   . Suicidal ideation   . Synovitis of right knee   . Tear of right rotator cuff 10/28/2021  . Thyroid nodule   . Tinnitus of both ears   . Tongue lesion   . Type 2 diabetes mellitus with hyperglycemia, without long-term current use of insulin     (CMD)   . Type 2 diabetes mellitus with hyperosmolarity without coma    (CMD)

## 2024-01-01 ENCOUNTER — Emergency Department (HOSPITAL_BASED_OUTPATIENT_CLINIC_OR_DEPARTMENT_OTHER)
Admission: EM | Admit: 2024-01-01 | Discharge: 2024-01-01 | Disposition: A | Discharge status: Home / Self Care | Attending: Emergency Medicine | Admitting: Emergency Medicine

## 2024-01-01 ENCOUNTER — Encounter (HOSPITAL_BASED_OUTPATIENT_CLINIC_OR_DEPARTMENT_OTHER): Payer: Self-pay | Admitting: *Deleted

## 2024-01-01 ENCOUNTER — Other Ambulatory Visit: Payer: Self-pay

## 2024-01-01 DIAGNOSIS — Z7982 Long term (current) use of aspirin: Secondary | ICD-10-CM | POA: Diagnosis not present

## 2024-01-01 DIAGNOSIS — R42 Dizziness and giddiness: Secondary | ICD-10-CM | POA: Diagnosis not present

## 2024-01-01 DIAGNOSIS — R739 Hyperglycemia, unspecified: Secondary | ICD-10-CM | POA: Insufficient documentation

## 2024-01-01 LAB — URINALYSIS, ROUTINE W REFLEX MICROSCOPIC
Bilirubin Urine: NEGATIVE
Glucose, UA: NEGATIVE mg/dL
Hgb urine dipstick: NEGATIVE
Ketones, ur: NEGATIVE mg/dL
Leukocytes,Ua: NEGATIVE
Nitrite: NEGATIVE
Protein, ur: NEGATIVE mg/dL
Specific Gravity, Urine: 1.01 (ref 1.005–1.030)
pH: 5.5 (ref 5.0–8.0)

## 2024-01-01 LAB — BASIC METABOLIC PANEL WITH GFR
Anion gap: 12 (ref 5–15)
BUN: 12 mg/dL (ref 6–20)
CO2: 24 mmol/L (ref 22–32)
Calcium: 9.7 mg/dL (ref 8.9–10.3)
Chloride: 98 mmol/L (ref 98–111)
Creatinine, Ser: 0.73 mg/dL (ref 0.44–1.00)
GFR, Estimated: >60 mL/min (ref >60)
Glucose, Bld: 186 mg/dL — ABNORMAL HIGH (ref 70–99)
Potassium: 4.2 mmol/L (ref 3.5–5.1)
Sodium: 134 mmol/L — ABNORMAL LOW (ref 135–145)

## 2024-01-01 LAB — CBG MONITORING, ED: Glucose-Capillary: 190 mg/dL — ABNORMAL HIGH (ref 70–99)

## 2024-01-01 LAB — RESP PANEL BY RT-PCR (RSV, FLU A&B, COVID)  RVPGX2
Influenza A by PCR: NEGATIVE
Influenza B by PCR: NEGATIVE
Resp Syncytial Virus by PCR: NEGATIVE
SARS Coronavirus 2 by RT PCR: NEGATIVE

## 2024-01-01 LAB — CBC
HCT: 38.1 % (ref 36.0–46.0)
Hemoglobin: 12.2 g/dL (ref 12.0–15.0)
MCH: 27.5 pg (ref 26.0–34.0)
MCHC: 32 g/dL (ref 30.0–36.0)
MCV: 86 fL (ref 80.0–100.0)
Platelets: 226 K/uL (ref 150–400)
RBC: 4.43 MIL/uL (ref 3.87–5.11)
RDW: 14.8 % (ref 11.5–15.5)
WBC: 8.4 K/uL (ref 4.0–10.5)
nRBC: 0 % (ref 0.0–0.2)

## 2024-01-01 LAB — PREGNANCY, URINE: Preg Test, Ur: NEGATIVE

## 2024-01-01 NOTE — ED Triage Notes (Signed)
 Pt had not been taking her medications for about a month and a half and she was feeling unwell on Sunday and went to [facility] and was found to have a CBG 333 and they prescribed her glipizide and advised her to start taking this and her metformin.  Pt took the medications yesterday and today but she continues to feel unwell with some nausea and blurred vision and light headed.  CBG at home 290 and here in triage it is 190.  No CP or sob.

## 2024-01-01 NOTE — ED Provider Notes (Signed)
 [facility] Provider Note   CSN: [id number] Arrival date & time: 01/01/24  1733     Patient presents with: Hyperglycemia   Shelly Mcclain is a 48 y.o. female who presented primarily out of concern for elevated blood sugars.  Was seen in urgent care yesterday, initiated on glipizide as well as continued taking her metformin.  Returns to the ED today as she stated that despite these medications her blood sugars continue to be elevated, review of previous medical records shows that yesterday at urgent care her blood sugar was 292, upon arrival to the ED today it was 190.  She has no chest pain, shortness of breath, dizziness, headaches, does endorse some blurred vision across the entirety of the visual fields.  Tolerating oral intake well, no nausea or vomiting.  Endorses intermittent dizziness however states that at present she is not dizzy at present.    Hyperglycemia Associated symptoms: dizziness        Prior to Admission medications   Medication Sig Start Date End Date Taking? Authorizing Provider  albuterol  (PROAIR  HFA) 108 (90 Base) MCG/ACT inhaler INHALE 2 PUFFS BY MOUTH EVERY 4 TO 6 HOURS AS NEEDED 12/11/18   [provider]  aspirin  81 MG EC tablet Take by mouth. 06/10/19   [provider]  atorvastatin  (LIPITOR) 20 MG tablet Take by mouth. 04/22/19   [provider]  azithromycin  (ZITHROMAX  Z-PAK) 250 MG tablet Take 2 tablets on day 1 followed by 1 tablet for the following 4 days 04/20/23   Smoot, Lauraine LABOR, PA-C  busPIRone  (BUSPAR ) 7.5 MG tablet Take by mouth. 08/01/19   [provider]  carbamazepine (EPITOL) 200 MG tablet  07/05/13   [provider]  empagliflozin (JARDIANCE) 10 MG TABS tablet Take by mouth. 12/11/18   [provider]  Fluticasone -Salmeterol (ADVAIR) 100-50 MCG/DOSE AEPB Inhale into the lungs. 11/18/16   [provider]  HYDROcodone -acetaminophen  (HYCET)  7.5-325 mg/15 ml solution Take 15 mLs by mouth 4 (four) times daily as needed for moderate pain (pain score 4-6). 04/20/23 04/19/24  Smoot, Lauraine LABOR, PA-C  levothyroxine  (SYNTHROID , LEVOTHROID) 75 MCG tablet Take 75 mcg by mouth daily before breakfast.    [provider]  metFORMIN (GLUCOPHAGE-XR) 500 MG 24 hr tablet Take by mouth. 12/11/18   [provider]  omeprazole (PRILOSEC) 40 MG capsule TAKE 1 CAPSULE BY MOUTH DAILY 11/18/16   [provider]  Oxcarbazepine  (TRILEPTAL ) 300 MG tablet Take 1 tab bid 08/01/19   [provider]  perphenazine  (TRILAFON ) 4 MG tablet Take by mouth. 06/27/19   [provider]  cetirizine  (ZYRTEC ) 10 MG tablet Take 1 tablet (10 mg total) by mouth daily. 10/31/16 09/14/19  Windle Almarie ORN, PA-C    Allergies: Other and Strawberry extract    Review of Systems  Eyes:  Positive for visual disturbance.  Neurological:  Positive for dizziness.  All other systems reviewed and are negative.   Updated Vital Signs BP 132/60   Pulse 92   Temp 97.8 F (36.6 C)   Resp 20   LMP 12/06/2023   SpO2 99%   Physical Exam Vitals and nursing note reviewed.  Constitutional:      General: She is not in acute distress.    Appearance: Normal appearance.  HENT:     Head: Normocephalic and atraumatic.     Mouth/Throat:     Mouth: Mucous membranes are moist.     Pharynx: Oropharynx is clear.  Eyes:     General: Lids are normal. Vision grossly intact. Gaze aligned appropriately.     Extraocular Movements: Extraocular movements intact.     Conjunctiva/sclera: Conjunctivae normal.     Pupils: Pupils are equal, round, and reactive to light.     Comments: Normal funduscopic exam bilaterally  Cardiovascular:     Rate and Rhythm: Normal rate and regular rhythm.     Pulses: Normal pulses.     Heart sounds: Normal heart sounds. No murmur heard.    No friction rub. No gallop.  Pulmonary:     Effort: Pulmonary effort is normal.     Breath  sounds: Normal breath sounds.  Abdominal:     General: Abdomen is flat. Bowel sounds are normal.     Palpations: Abdomen is soft.  Musculoskeletal:        General: Normal range of motion.     Cervical back: Normal range of motion and neck supple.     Right lower leg: No edema.     Left lower leg: No edema.  Skin:    General: Skin is warm and dry.     Capillary Refill: Capillary refill takes less than 2 seconds.  Neurological:     General: No focal deficit present.     Mental Status: She is alert. Mental status is at baseline.  Psychiatric:        Mood and Affect: Mood normal.     (all labs ordered are listed, but only abnormal results are displayed) Labs Reviewed  BASIC METABOLIC PANEL WITH GFR - Abnormal; Notable for the following components:      Result Value   Sodium 134 (*)    Glucose, Bld 186 (*)    All other components within normal limits  CBG MONITORING, ED - Abnormal; Notable for the following components:   Glucose-Capillary 190 (*)    All other components within normal limits  RESP PANEL BY RT-PCR (RSV, FLU A&B, COVID)  RVPGX2  CBC  URINALYSIS, ROUTINE W REFLEX MICROSCOPIC  PREGNANCY, URINE    EKG: None  Radiology: No results found.   Procedures   Medications Ordered in the ED - No data to display                                  Medical Decision Making Amount and/or Complexity of Data Reviewed Labs: ordered.   Medical Decision Making:   Shelly Mcclain is a 48 y.o. female who presented to the ED today with hyperglycemia detailed above.    External chart has been reviewed including previous labs and imaging. Complete initial physical exam performed, notably the patient  was alert and oriented in no apparent distress with no concerning exam findings..    Reviewed and confirmed nursing documentation for past medical history, family history, social history.    Initial Assessment:   With the patient's presentation of hyperglycemia, most likely  diagnosis is uncomplicated hyperglycemia.  Physical exam is largely unremarkable at this time.  Initial Plan:  Assess visual acuity Screening labs including CBC and Metabolic panel to evaluate for infectious or metabolic etiology of disease.  Urinalysis with reflex culture ordered to evaluate for UTI or relevant urologic/nephrologic pathology.  Respiratory panel to evaluate for flu/COVID/RSV. Objective evaluation as below reviewed   Initial Study Results:   Laboratory  All laboratory results reviewed without evidence of clinically relevant pathology.   Exceptions include: Glucose on BMP is  186   Reassessment and Plan:   Physical exam of this patient is reassuring as funduscopic did not show any concerning findings on exam, physical exam is largely unremarkable.  Lab evaluation showed a elevated glucose however it is decreased from yesterday.  UA does not show any glycosuria, and she does not appear to be hemoconcentrated at this time.  Vital signs are within normal limits.  As such, believe that she can continue her outpatient glipizide and metformin, follow-up with primary care as scheduled secondary to controlled and uncomplicated hyperglycemia.  This was discussed with the patient, she understands and agrees has no further concerns at this time.       Final diagnoses:  Hyperglycemia    ED Discharge Orders     None          Myriam Dorn BROCKS, [place] 01/01/24 1934    Tegeler, Lonni PARAS, MD 01/01/24 [phone]
